# Patient Record
Sex: Female | Born: 1960 | Race: White | Hispanic: No | Marital: Married | Smoking: Never smoker
Health system: Southern US, Community
[De-identification: ages and names within clinical notes are randomized; demographics above are authoritative.]

## PROBLEM LIST (undated history)

## (undated) DIAGNOSIS — E039 Hypothyroidism, unspecified: Secondary | ICD-10-CM

## (undated) DIAGNOSIS — N189 Chronic kidney disease, unspecified: Secondary | ICD-10-CM

## (undated) DIAGNOSIS — T8859XA Other complications of anesthesia, initial encounter: Secondary | ICD-10-CM

## (undated) DIAGNOSIS — G709 Myoneural disorder, unspecified: Secondary | ICD-10-CM

## (undated) DIAGNOSIS — I999 Unspecified disorder of circulatory system: Secondary | ICD-10-CM

## (undated) DIAGNOSIS — L900 Lichen sclerosus et atrophicus: Secondary | ICD-10-CM

## (undated) DIAGNOSIS — I10 Essential (primary) hypertension: Secondary | ICD-10-CM

## (undated) DIAGNOSIS — G4700 Insomnia, unspecified: Secondary | ICD-10-CM

## (undated) DIAGNOSIS — M1990 Unspecified osteoarthritis, unspecified site: Secondary | ICD-10-CM

## (undated) DIAGNOSIS — T4145XA Adverse effect of unspecified anesthetic, initial encounter: Secondary | ICD-10-CM

## (undated) HISTORY — PX: KIDNEY STONE SURGERY: SHX686

## (undated) HISTORY — PX: ABDOMINAL HYSTERECTOMY: SHX81

---

## 2006-01-18 HISTORY — PX: KNEE ARTHROSCOPY: SUR90

## 2009-01-18 HISTORY — PX: OTHER SURGICAL HISTORY: SHX169

## 2011-02-23 ENCOUNTER — Other Ambulatory Visit: Payer: Self-pay | Admitting: Neurosurgery

## 2011-02-23 DIAGNOSIS — M545 Low back pain: Secondary | ICD-10-CM

## 2011-03-01 ENCOUNTER — Ambulatory Visit
Admission: RE | Admit: 2011-03-01 | Discharge: 2011-03-01 | Disposition: A | Discharge status: Home / Self Care | Payer: BC Managed Care – PPO | Source: Ambulatory Visit | Attending: Neurosurgery | Admitting: Neurosurgery

## 2011-03-01 DIAGNOSIS — M545 Low back pain: Secondary | ICD-10-CM

## 2011-03-24 ENCOUNTER — Other Ambulatory Visit: Payer: Self-pay | Admitting: Neurosurgery

## 2011-03-31 ENCOUNTER — Encounter (HOSPITAL_COMMUNITY): Payer: Self-pay | Admitting: Pharmacy Technician

## 2011-04-01 ENCOUNTER — Other Ambulatory Visit (HOSPITAL_COMMUNITY): Payer: Self-pay | Admitting: *Deleted

## 2011-04-01 NOTE — Pre-Procedure Instructions (Addendum)
20 Beverly Harrison  04/01/2011   Your procedure is scheduled on:  Friday, March 22nd.  Report to [facility] at 5:30AM.   Call this number if you have problems the morning of surgery: [phone]   Remember:   Do not eat food:After Midnight.  May have clear liquids: up to 4 Hours before arrival.  Clear liquids include soda, tea, black coffee, apple or grape juice, broth.  Take these medicines the morning of surgery with A SIP OF WATER: Atenolol,Synthyroid, Xanax.          Discontinue Aspirin, Coumadin, Plavix, Effient and Herbal Medications.   Do not wear jewelry, make-up or nail polish.  Do not wear lotions, powders, or perfumes. You may wear deodorant.  Do not shave 48 hours prior to surgery.  Do not bring valuables to the hospital.  Contacts, dentures or bridgework may not be worn into surgery.  Leave suitcase in the car. After surgery it may be brought to your room.  For patients admitted to the hospital, checkout time is 11:00 AM the day of discharge.   Patients discharged the day of surgery will not be allowed to drive home.  Name and phone number of your driver: --  Special Instructions: CHG Shower Use Special Wash: 1/2 bottle night before surgery and 1/2 bottle morning of surgery.   Please read over the following fact sheets that you were given: Pain Booklet, Coughing and Deep Breathing, MRSA Information and Surgical Site Infection Prevention

## 2011-04-02 ENCOUNTER — Encounter (HOSPITAL_COMMUNITY)
Admission: RE | Admit: 2011-04-02 | Discharge: 2011-04-02 | Disposition: A | Discharge status: Home / Self Care | Payer: BC Managed Care – PPO | Source: Ambulatory Visit | Attending: Anesthesiology | Admitting: Anesthesiology

## 2011-04-02 ENCOUNTER — Encounter (HOSPITAL_COMMUNITY)
Admission: RE | Admit: 2011-04-02 | Discharge: 2011-04-02 | Disposition: A | Discharge status: Home / Self Care | Payer: BC Managed Care – PPO | Source: Ambulatory Visit | Attending: Neurosurgery | Admitting: Neurosurgery

## 2011-04-02 ENCOUNTER — Encounter (HOSPITAL_COMMUNITY): Payer: Self-pay

## 2011-04-02 ENCOUNTER — Other Ambulatory Visit: Payer: Self-pay

## 2011-04-02 HISTORY — DX: Essential (primary) hypertension: I10

## 2011-04-02 HISTORY — DX: Unspecified disorder of circulatory system: I99.9

## 2011-04-02 HISTORY — DX: Hypothyroidism, unspecified: E03.9

## 2011-04-02 HISTORY — DX: Adverse effect of unspecified anesthetic, initial encounter: T41.45XA

## 2011-04-02 HISTORY — DX: Myoneural disorder, unspecified: G70.9

## 2011-04-02 HISTORY — DX: Lichen sclerosus et atrophicus: L90.0

## 2011-04-02 HISTORY — DX: Insomnia, unspecified: G47.00

## 2011-04-02 HISTORY — DX: Other complications of anesthesia, initial encounter: T88.59XA

## 2011-04-02 HISTORY — DX: Chronic kidney disease, unspecified: N18.9

## 2011-04-02 HISTORY — DX: Unspecified osteoarthritis, unspecified site: M19.90

## 2011-04-02 LAB — SURGICAL PCR SCREEN: MRSA, PCR: NEGATIVE

## 2011-04-02 NOTE — Progress Notes (Signed)
Mrs Siple states that she had labs drawn at medical DRs office this week.  I faxed a request for labs and will do any necessary labs on the day of surgery.

## 2011-04-02 NOTE — Progress Notes (Signed)
I spoke with patient and instructed her to stop taking NSAIDS.  Pt said, "then I won't have any pain meds."  I instructed pt to call MD for a new pain medication.

## 2011-04-05 NOTE — Progress Notes (Signed)
Message left with Dr. Thomes Lolling office [phone]) to fax copy of labwork done last week to [phone].

## 2011-04-08 MED ORDER — CEFAZOLIN SODIUM-DEXTROSE 2-3 GM-% IV SOLR
2.0000 g | INTRAVENOUS | Status: AC
  Administered 2011-04-09: 2 g via INTRAVENOUS
  Filled 2011-04-08: qty 50
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
## 2011-04-09 ENCOUNTER — Ambulatory Visit (HOSPITAL_COMMUNITY)
Admission: RE | Admit: 2011-04-09 | Discharge: 2011-04-10 | Disposition: A | Discharge status: Home / Self Care | Payer: BC Managed Care – PPO | Source: Ambulatory Visit | Attending: Neurosurgery | Admitting: Neurosurgery

## 2011-04-09 ENCOUNTER — Encounter (HOSPITAL_COMMUNITY)
Admission: RE | Disposition: A | Discharge status: Home / Self Care | Payer: Self-pay | Source: Ambulatory Visit | Attending: Neurosurgery

## 2011-04-09 ENCOUNTER — Encounter (HOSPITAL_COMMUNITY): Payer: Self-pay | Admitting: Surgery

## 2011-04-09 ENCOUNTER — Ambulatory Visit (HOSPITAL_COMMUNITY): Payer: BC Managed Care – PPO | Admitting: Anesthesiology

## 2011-04-09 ENCOUNTER — Encounter (HOSPITAL_COMMUNITY): Payer: Self-pay | Admitting: Anesthesiology

## 2011-04-09 ENCOUNTER — Ambulatory Visit (HOSPITAL_COMMUNITY): Payer: BC Managed Care – PPO

## 2011-04-09 DIAGNOSIS — E119 Type 2 diabetes mellitus without complications: Secondary | ICD-10-CM | POA: Insufficient documentation

## 2011-04-09 DIAGNOSIS — Z01810 Encounter for preprocedural cardiovascular examination: Secondary | ICD-10-CM | POA: Insufficient documentation

## 2011-04-09 DIAGNOSIS — I10 Essential (primary) hypertension: Secondary | ICD-10-CM | POA: Insufficient documentation

## 2011-04-09 DIAGNOSIS — M5020 Other cervical disc displacement, unspecified cervical region: Secondary | ICD-10-CM | POA: Insufficient documentation

## 2011-04-09 DIAGNOSIS — Z01812 Encounter for preprocedural laboratory examination: Secondary | ICD-10-CM | POA: Insufficient documentation

## 2011-04-09 DIAGNOSIS — M47812 Spondylosis without myelopathy or radiculopathy, cervical region: Secondary | ICD-10-CM | POA: Insufficient documentation

## 2011-04-09 DIAGNOSIS — Z01811 Encounter for preprocedural respiratory examination: Secondary | ICD-10-CM | POA: Insufficient documentation

## 2011-04-09 DIAGNOSIS — M5030 Other cervical disc degeneration, unspecified cervical region: Secondary | ICD-10-CM | POA: Insufficient documentation

## 2011-04-09 DIAGNOSIS — Z01818 Encounter for other preprocedural examination: Secondary | ICD-10-CM | POA: Insufficient documentation

## 2011-04-09 HISTORY — PX: ANTERIOR CERVICAL DECOMP/DISCECTOMY FUSION: SHX1161

## 2011-04-09 LAB — GLUCOSE, CAPILLARY: Glucose-Capillary: 132 mg/dL — ABNORMAL HIGH (ref 70–99)

## 2011-04-09 LAB — BASIC METABOLIC PANEL
BUN: 14 mg/dL (ref 6–23)
Chloride: 105 mEq/L (ref 96–112)
Creatinine, Ser: 0.78 mg/dL (ref 0.50–1.10)
GFR calc Af Amer: >90 mL/min (ref >90)

## 2011-04-09 LAB — CBC
Platelets: 220 10*3/uL (ref 150–400)
RBC: 4.48 MIL/uL (ref 3.87–5.11)
RDW: 13.9 % (ref 11.5–15.5)
WBC: 5.7 10*3/uL (ref 4.0–10.5)

## 2011-04-09 SURGERY — ANTERIOR CERVICAL DECOMPRESSION/DISCECTOMY FUSION 2 LEVELS
Anesthesia: General

## 2011-04-09 MED ORDER — DIAZEPAM 5 MG PO TABS
5.0000 mg | ORAL_TABLET | Freq: Four times a day (QID) | ORAL | Status: DC | PRN
  Administered 2011-04-09 – 2011-04-10 (×2): 5 mg via ORAL
  Filled 2011-04-09 (×2): qty 1

## 2011-04-09 MED ORDER — LINAGLIPTIN 5 MG PO TABS
5.0000 mg | ORAL_TABLET | Freq: Every evening | ORAL | Status: DC
  Filled 2011-04-09 (×2): qty 1

## 2011-04-09 MED ORDER — ATENOLOL 25 MG PO TABS
25.0000 mg | ORAL_TABLET | Freq: Every day | ORAL | Status: DC
  Administered 2011-04-09: 25 mg via ORAL
  Filled 2011-04-09 (×2): qty 1

## 2011-04-09 MED ORDER — LIDOCAINE HCL (CARDIAC) 20 MG/ML IV SOLN
INTRAVENOUS | Status: DC | PRN
  Administered 2011-04-09: 80 mg via INTRAVENOUS

## 2011-04-09 MED ORDER — SODIUM CHLORIDE 0.9 % IJ SOLN: 3.0000 mL | INTRAMUSCULAR | Status: DC | PRN

## 2011-04-09 MED ORDER — FENTANYL CITRATE 0.05 MG/ML IJ SOLN
INTRAMUSCULAR | Status: DC | PRN
  Administered 2011-04-09: 100 ug via INTRAVENOUS
  Administered 2011-04-09: 50 ug via INTRAVENOUS

## 2011-04-09 MED ORDER — SODIUM CHLORIDE 0.9 % IV SOLN
INTRAVENOUS | Status: AC
  Filled 2011-04-09: qty 500

## 2011-04-09 MED ORDER — 0.9 % SODIUM CHLORIDE (POUR BTL) OPTIME
TOPICAL | Status: DC | PRN
  Administered 2011-04-09: 1000 mL

## 2011-04-09 MED ORDER — INSULIN ASPART 100 UNIT/ML ~~LOC~~ SOLN
0.0000 [IU] | Freq: Three times a day (TID) | SUBCUTANEOUS | Status: DC
  Administered 2011-04-09: 8 [IU] via SUBCUTANEOUS

## 2011-04-09 MED ORDER — DOCUSATE SODIUM 100 MG PO CAPS
100.0000 mg | ORAL_CAPSULE | Freq: Two times a day (BID) | ORAL | Status: DC
  Administered 2011-04-09 – 2011-04-10 (×2): 100 mg via ORAL
  Filled 2011-04-09 (×3): qty 1

## 2011-04-09 MED ORDER — HYDROMORPHONE HCL PF 1 MG/ML IJ SOLN
0.2500 mg | INTRAMUSCULAR | Status: DC | PRN
  Administered 2011-04-09 (×4): 0.5 mg via INTRAVENOUS

## 2011-04-09 MED ORDER — LACTATED RINGERS IV SOLN
INTRAVENOUS | Status: DC | PRN
  Administered 2011-04-09 (×2): via INTRAVENOUS

## 2011-04-09 MED ORDER — GLYCOPYRROLATE 0.2 MG/ML IJ SOLN
INTRAMUSCULAR | Status: DC | PRN
  Administered 2011-04-09: 0.4 mg via INTRAVENOUS

## 2011-04-09 MED ORDER — HEMOSTATIC AGENTS (NO CHARGE) OPTIME
TOPICAL | Status: DC | PRN
  Administered 2011-04-09: 1 via TOPICAL

## 2011-04-09 MED ORDER — ATENOLOL 50 MG PO TABS
50.0000 mg | ORAL_TABLET | Freq: Every day | ORAL | Status: DC
  Administered 2011-04-10: 50 mg via ORAL
  Filled 2011-04-09: qty 1

## 2011-04-09 MED ORDER — MORPHINE SULFATE 4 MG/ML IJ SOLN
1.0000 mg | INTRAMUSCULAR | Status: DC | PRN
  Administered 2011-04-09 (×3): 4 mg via INTRAVENOUS
  Filled 2011-04-09 (×2): qty 1

## 2011-04-09 MED ORDER — B COMPLEX-C PO TABS
1.0000 | ORAL_TABLET | Freq: Every day | ORAL | Status: DC
  Administered 2011-04-10: 1 via ORAL
  Filled 2011-04-09 (×2): qty 1

## 2011-04-09 MED ORDER — ACETAMINOPHEN 325 MG PO TABS: 650.0000 mg | ORAL_TABLET | ORAL | Status: DC | PRN

## 2011-04-09 MED ORDER — MENTHOL 3 MG MT LOZG
1.0000 | LOZENGE | OROMUCOSAL | Status: DC | PRN
  Filled 2011-04-09: qty 9

## 2011-04-09 MED ORDER — THROMBIN 5000 UNITS EX KIT
PACK | CUTANEOUS | Status: DC | PRN
  Administered 2011-04-09 (×2): 5000 [IU] via TOPICAL

## 2011-04-09 MED ORDER — GLYBURIDE 1.25 MG PO TABS
1.2500 mg | ORAL_TABLET | Freq: Every evening | ORAL | Status: DC
  Filled 2011-04-09 (×2): qty 1

## 2011-04-09 MED ORDER — OXYCODONE-ACETAMINOPHEN 5-325 MG PO TABS
1.0000 | ORAL_TABLET | ORAL | Status: DC | PRN
  Administered 2011-04-09 – 2011-04-10 (×3): 2 via ORAL
  Filled 2011-04-09 (×3): qty 2

## 2011-04-09 MED ORDER — CEFAZOLIN SODIUM 1-5 GM-% IV SOLN
1.0000 g | Freq: Three times a day (TID) | INTRAVENOUS | Status: AC
  Administered 2011-04-09 – 2011-04-10 (×2): 1 g via INTRAVENOUS
  Filled 2011-04-09 (×2): qty 50

## 2011-04-09 MED ORDER — MIDAZOLAM HCL 5 MG/5ML IJ SOLN
INTRAMUSCULAR | Status: DC | PRN
  Administered 2011-04-09 (×2): 1 mg via INTRAVENOUS

## 2011-04-09 MED ORDER — HYDROMORPHONE HCL PF 1 MG/ML IJ SOLN
INTRAMUSCULAR | Status: AC
  Filled 2011-04-09: qty 1

## 2011-04-09 MED ORDER — OMEGA-3-ACID ETHYL ESTERS 1 G PO CAPS
1.0000 g | ORAL_CAPSULE | Freq: Every day | ORAL | Status: DC
  Administered 2011-04-10: 1 g via ORAL
  Filled 2011-04-09 (×2): qty 1

## 2011-04-09 MED ORDER — ATENOLOL 25 MG PO TABS: 25.0000 mg | ORAL_TABLET | Freq: Every day | ORAL | Status: DC

## 2011-04-09 MED ORDER — DEXTROSE 5 % IV SOLN
INTRAVENOUS | Status: DC | PRN
  Administered 2011-04-09: 08:00:00 via INTRAVENOUS

## 2011-04-09 MED ORDER — PROPOFOL 10 MG/ML IV EMUL
INTRAVENOUS | Status: DC | PRN
  Administered 2011-04-09: 150 mg via INTRAVENOUS

## 2011-04-09 MED ORDER — BACITRACIN 50000 UNITS IM SOLR
INTRAMUSCULAR | Status: AC
  Filled 2011-04-09: qty 1

## 2011-04-09 MED ORDER — ONDANSETRON HCL 4 MG/2ML IJ SOLN
4.0000 mg | INTRAMUSCULAR | Status: DC | PRN
  Administered 2011-04-09: 4 mg via INTRAVENOUS
  Filled 2011-04-09: qty 2

## 2011-04-09 MED ORDER — LACTATED RINGERS IV SOLN: INTRAVENOUS | Status: DC

## 2011-04-09 MED ORDER — ZOLPIDEM TARTRATE 5 MG PO TABS: 10.0000 mg | ORAL_TABLET | Freq: Every evening | ORAL | Status: DC | PRN

## 2011-04-09 MED ORDER — KCL IN DEXTROSE-NACL 20-5-0.45 MEQ/L-%-% IV SOLN
INTRAVENOUS | Status: DC
  Administered 2011-04-09: 16:00:00 via INTRAVENOUS
  Filled 2011-04-09 (×3): qty 1000

## 2011-04-09 MED ORDER — PANTOPRAZOLE SODIUM 40 MG IV SOLR
40.0000 mg | Freq: Every day | INTRAVENOUS | Status: DC
  Filled 2011-04-09: qty 40

## 2011-04-09 MED ORDER — DEXAMETHASONE SODIUM PHOSPHATE 4 MG/ML IJ SOLN
INTRAMUSCULAR | Status: DC | PRN
  Administered 2011-04-09: 4 mg via INTRAVENOUS

## 2011-04-09 MED ORDER — HYDROCHLOROTHIAZIDE 12.5 MG PO CAPS
12.5000 mg | ORAL_CAPSULE | Freq: Every day | ORAL | Status: DC
  Administered 2011-04-10: 12.5 mg via ORAL
  Filled 2011-04-09: qty 1

## 2011-04-09 MED ORDER — SODIUM CHLORIDE 0.9 % IR SOLN
Status: DC | PRN
  Administered 2011-04-09: 07:00:00

## 2011-04-09 MED ORDER — HYDROMORPHONE HCL 2 MG PO TABS
2.0000 mg | ORAL_TABLET | ORAL | Status: DC | PRN
  Administered 2011-04-09: 2 mg via ORAL
  Filled 2011-04-09 (×2): qty 1

## 2011-04-09 MED ORDER — ZOLPIDEM TARTRATE 5 MG PO TABS: 5.0000 mg | ORAL_TABLET | Freq: Every evening | ORAL | Status: DC | PRN

## 2011-04-09 MED ORDER — ACETAMINOPHEN 650 MG RE SUPP: 650.0000 mg | RECTAL | Status: DC | PRN

## 2011-04-09 MED ORDER — LEVOTHYROXINE SODIUM 125 MCG PO TABS
125.0000 ug | ORAL_TABLET | Freq: Every day | ORAL | Status: DC
  Administered 2011-04-10: 125 ug via ORAL
  Filled 2011-04-09: qty 1

## 2011-04-09 MED ORDER — SIMVASTATIN 5 MG PO TABS
5.0000 mg | ORAL_TABLET | Freq: Every day | ORAL | Status: DC
  Filled 2011-04-09 (×2): qty 1

## 2011-04-09 MED ORDER — ALPRAZOLAM 0.5 MG PO TABS: 0.5000 mg | ORAL_TABLET | Freq: Three times a day (TID) | ORAL | Status: DC | PRN

## 2011-04-09 MED ORDER — ADULT MULTIVITAMIN W/MINERALS CH
1.0000 | ORAL_TABLET | Freq: Every day | ORAL | Status: DC
  Administered 2011-04-10: 1 via ORAL
  Filled 2011-04-09 (×2): qty 1

## 2011-04-09 MED ORDER — LOSARTAN POTASSIUM-HCTZ 100-12.5 MG PO TABS: 1.0000 | ORAL_TABLET | Freq: Every evening | ORAL | Status: DC

## 2011-04-09 MED ORDER — KCL IN DEXTROSE-NACL 20-5-0.45 MEQ/L-%-% IV SOLN
INTRAVENOUS | Status: AC
  Filled 2011-04-09: qty 1000

## 2011-04-09 MED ORDER — ONDANSETRON HCL 4 MG/2ML IJ SOLN
INTRAMUSCULAR | Status: DC | PRN
  Administered 2011-04-09: 4 mg via INTRAVENOUS

## 2011-04-09 MED ORDER — METFORMIN HCL ER 500 MG PO TB24
500.0000 mg | ORAL_TABLET | Freq: Two times a day (BID) | ORAL | Status: DC
  Administered 2011-04-10: 500 mg via ORAL
  Filled 2011-04-09 (×4): qty 1

## 2011-04-09 MED ORDER — NEOSTIGMINE METHYLSULFATE 1 MG/ML IJ SOLN
INTRAMUSCULAR | Status: DC | PRN
  Administered 2011-04-09: 3 mg via INTRAVENOUS

## 2011-04-09 MED ORDER — BUPIVACAINE HCL (PF) 0.5 % IJ SOLN
INTRAMUSCULAR | Status: DC | PRN
  Administered 2011-04-09: 5 mL

## 2011-04-09 MED ORDER — LIDOCAINE-EPINEPHRINE 1 %-1:100000 IJ SOLN
INTRAMUSCULAR | Status: DC | PRN
  Administered 2011-04-09: 5 mL

## 2011-04-09 MED ORDER — PHENOL 1.4 % MT LIQD
1.0000 | OROMUCOSAL | Status: DC | PRN
  Administered 2011-04-09: 1 via OROMUCOSAL
  Filled 2011-04-09: qty 177

## 2011-04-09 MED ORDER — SODIUM CHLORIDE 0.9 % IV SOLN: 250.0000 mL | INTRAVENOUS | Status: DC

## 2011-04-09 MED ORDER — PANTOPRAZOLE SODIUM 40 MG PO TBEC
40.0000 mg | DELAYED_RELEASE_TABLET | Freq: Every day | ORAL | Status: DC
  Filled 2011-04-09 (×2): qty 1

## 2011-04-09 MED ORDER — MORPHINE SULFATE 2 MG/ML IJ SOLN: 0.0500 mg/kg | INTRAMUSCULAR | Status: DC | PRN

## 2011-04-09 MED ORDER — ROCURONIUM BROMIDE 100 MG/10ML IV SOLN
INTRAVENOUS | Status: DC | PRN
  Administered 2011-04-09: 10 mg via INTRAVENOUS
  Administered 2011-04-09: 50 mg via INTRAVENOUS

## 2011-04-09 MED ORDER — LOSARTAN POTASSIUM 50 MG PO TABS
100.0000 mg | ORAL_TABLET | Freq: Every day | ORAL | Status: DC
  Administered 2011-04-10: 100 mg via ORAL
  Filled 2011-04-09: qty 2

## 2011-04-09 MED ORDER — SODIUM CHLORIDE 0.9 % IJ SOLN: 3.0000 mL | Freq: Two times a day (BID) | INTRAMUSCULAR | Status: DC

## 2011-04-09 MED ORDER — VITAMIN B COMPLEX PO TABS: 1.0000 | ORAL_TABLET | Freq: Every day | ORAL | Status: DC

## 2011-04-09 SURGICAL SUPPLY — 67 items
BAG DECANTER FOR FLEXI CONT (MISCELLANEOUS) ×2 IMPLANT
BANDAGE GAUZE ELAST BULKY 4 IN (GAUZE/BANDAGES/DRESSINGS) ×4 IMPLANT
BENZOIN TINCTURE PRP APPL 2/3 (GAUZE/BANDAGES/DRESSINGS) ×2 IMPLANT
BIT DRILL NEURO 2X3.1 SFT TUCH (MISCELLANEOUS) ×1 IMPLANT
BLADE ULTRA TIP 2M (BLADE) IMPLANT
BLOCK PROFUSE SZ 6 (Bone Implant) ×2 IMPLANT
BUR BARREL STRAIGHT FLUTE 4.0 (BURR) ×2 IMPLANT
CAGE CERVICAL 8MM (Cage) ×4 IMPLANT
CANISTER SUCTION 2500CC (MISCELLANEOUS) ×2 IMPLANT
CLOTH BEACON ORANGE TIMEOUT ST (SAFETY) ×2 IMPLANT
CONT SPEC 4OZ CLIKSEAL STRL BL (MISCELLANEOUS) ×2 IMPLANT
COVER MAYO STAND STRL (DRAPES) ×2 IMPLANT
DERMABOND ADVANCED (GAUZE/BANDAGES/DRESSINGS) ×1
DERMABOND ADVANCED .7 DNX12 (GAUZE/BANDAGES/DRESSINGS) ×1 IMPLANT
DRAPE LAPAROTOMY 100X72 PEDS (DRAPES) ×2 IMPLANT
DRAPE MICROSCOPE LEICA (MISCELLANEOUS) ×2 IMPLANT
DRAPE POUCH INSTRU U-SHP 10X18 (DRAPES) ×2 IMPLANT
DRAPE PROXIMA HALF (DRAPES) IMPLANT
DRESSING TELFA 8X3 (GAUZE/BANDAGES/DRESSINGS) ×2 IMPLANT
DRILL NEURO 2X3.1 SOFT TOUCH (MISCELLANEOUS) ×2
DURAPREP 6ML APPLICATOR 50/CS (WOUND CARE) ×2 IMPLANT
ELECT COATED BLADE 2.86 ST (ELECTRODE) ×2 IMPLANT
ELECT REM PT RETURN 9FT ADLT (ELECTROSURGICAL) ×2
ELECTRODE REM PT RTRN 9FT ADLT (ELECTROSURGICAL) ×1 IMPLANT
GAUZE SPONGE 4X4 16PLY XRAY LF (GAUZE/BANDAGES/DRESSINGS) IMPLANT
GLOVE BIO SURGEON STRL SZ8 (GLOVE) ×2 IMPLANT
GLOVE BIOGEL M 8.0 STRL (GLOVE) ×2 IMPLANT
GLOVE BIOGEL PI IND STRL 8 (GLOVE) ×1 IMPLANT
GLOVE BIOGEL PI IND STRL 8.5 (GLOVE) ×1 IMPLANT
GLOVE BIOGEL PI INDICATOR 8 (GLOVE) ×1
GLOVE BIOGEL PI INDICATOR 8.5 (GLOVE) ×1
GLOVE ECLIPSE 8.0 STRL XLNG CF (GLOVE) ×2 IMPLANT
GLOVE EXAM NITRILE LRG STRL (GLOVE) IMPLANT
GLOVE EXAM NITRILE MD LF STRL (GLOVE) IMPLANT
GLOVE EXAM NITRILE XL STR (GLOVE) IMPLANT
GLOVE EXAM NITRILE XS STR PU (GLOVE) IMPLANT
GLOVE INDICATOR 7.5 STRL GRN (GLOVE) ×2 IMPLANT
GLOVE SS BIOGEL STRL SZ 7 (GLOVE) ×1 IMPLANT
GLOVE SUPERSENSE BIOGEL SZ 7 (GLOVE) ×1
GOWN BRE IMP SLV AUR LG STRL (GOWN DISPOSABLE) ×2 IMPLANT
GOWN BRE IMP SLV AUR XL STRL (GOWN DISPOSABLE) ×4 IMPLANT
GOWN STRL REIN 2XL LVL4 (GOWN DISPOSABLE) ×2 IMPLANT
HEAD HALTER (SOFTGOODS) ×2 IMPLANT
KIT BASIN OR (CUSTOM PROCEDURE TRAY) ×2 IMPLANT
KIT ROOM TURNOVER OR (KITS) ×2 IMPLANT
NEEDLE HYPO 18GX1.5 BLUNT FILL (NEEDLE) ×2 IMPLANT
NEEDLE HYPO 25X1 1.5 SAFETY (NEEDLE) ×2 IMPLANT
NEEDLE SPNL 22GX3.5 QUINCKE BK (NEEDLE) ×2 IMPLANT
NS IRRIG 1000ML POUR BTL (IV SOLUTION) ×2 IMPLANT
PACK LAMINECTOMY NEURO (CUSTOM PROCEDURE TRAY) ×2 IMPLANT
PAD ARMBOARD 7.5X6 YLW CONV (MISCELLANEOUS) ×2 IMPLANT
PIN DISTRACTION 14MM (PIN) ×4 IMPLANT
PLATE 32MM (Plate) ×2 IMPLANT
RUBBERBAND STERILE (MISCELLANEOUS) ×4 IMPLANT
SCREW 12MM (Screw) ×12 IMPLANT
SPONGE GAUZE 4X4 12PLY (GAUZE/BANDAGES/DRESSINGS) IMPLANT
SPONGE INTESTINAL PEANUT (DISPOSABLE) ×2 IMPLANT
SPONGE SURGIFOAM ABS GEL SZ50 (HEMOSTASIS) ×6 IMPLANT
STAPLER SKIN PROX WIDE 3.9 (STAPLE) ×2 IMPLANT
STRIP CLOSURE SKIN 1/2X4 (GAUZE/BANDAGES/DRESSINGS) ×2 IMPLANT
SUT VIC AB 3-0 SH 8-18 (SUTURE) ×2 IMPLANT
SYR 20ML ECCENTRIC (SYRINGE) ×2 IMPLANT
SYR 3ML LL SCALE MARK (SYRINGE) ×2 IMPLANT
TOWEL OR 17X24 6PK STRL BLUE (TOWEL DISPOSABLE) ×2 IMPLANT
TOWEL OR 17X26 10 PK STRL BLUE (TOWEL DISPOSABLE) ×2 IMPLANT
TRAP SPECIMEN MUCOUS 40CC (MISCELLANEOUS) ×2 IMPLANT
WATER STERILE IRR 1000ML POUR (IV SOLUTION) ×2 IMPLANT

## 2011-04-09 NOTE — Anesthesia Postprocedure Evaluation (Signed)
  Anesthesia Post-op Note  Patient: Beverly Harrison  Procedure(s) Performed: Procedure(s) (LRB): ANTERIOR CERVICAL DECOMPRESSION/DISCECTOMY FUSION 2 LEVELS (N/A)  Patient Location: PACU  Anesthesia Type: General  Level of Consciousness: awake  Airway and Oxygen Therapy: Patient Spontanous Breathing  Post-op Pain: mild  Post-op Assessment: Post-op Vital signs reviewed  Post-op Vital Signs: Reviewed  Complications: No apparent anesthesia complications

## 2011-04-09 NOTE — Progress Notes (Signed)
Patient has had some nausea and vomiting with elevated blood sugars.  Strength full bilateral deltoids, biceps, triceps, hand intrinsics.  Dressing CDI. Overall doing well, but will observe tonight.

## 2011-04-09 NOTE — Progress Notes (Signed)
Medicated with po dilaudid, pt then to vomit with mod amt emesis noted, medicated with IV morphine at this time

## 2011-04-09 NOTE — Preoperative (Signed)
Beta Blockers   Reason not to administer Beta Blockers:Not Applicable 

## 2011-04-09 NOTE — Interval H&P Note (Signed)
History and Physical Interval Note:  04/09/2011 7:22 AM  Beverly Harrison  has presented today for surgery, with the diagnosis of cervical herniated disc cervical spondylosis cervical radiculopathy  The various methods of treatment have been discussed with the patient and family. After consideration of risks, benefits and other options for treatment, the patient has consented to  Procedure(s) (LRB): ANTERIOR CERVICAL DECOMPRESSION/DISCECTOMY FUSION 2 LEVELS (N/A) as a surgical intervention .  The patients' history has been reviewed, patient examined, no change in status, stable for surgery.  I have reviewed the patients' chart and labs.  Questions were answered to the patient's satisfaction.     [name] D  Date of Initial H&P: 03/24/2011  History reviewed, patient examined, no change in status, stable for surgery.

## 2011-04-09 NOTE — Plan of Care (Signed)
Problem: Consults Goal: Spinal Surgery Patient Education See Patient Education Module for education specifics. Outcome: Completed/Met Date Met:  04/09/11 Turn cough deep  Breath, log roll,

## 2011-04-09 NOTE — Progress Notes (Signed)
Orthopedic Tech Progress Note Patient Details:  Beverly Harrison [birth year] [id number]  Other Ortho Devices Type of Ortho Device: [place] cervical collar (soft cervical collar) Ortho Device Location: neck Ortho Device Interventions: Application   Donn Wilmot 04/09/2011, 4:40 PM

## 2011-04-09 NOTE — Transfer of Care (Signed)
Immediate Anesthesia Transfer of Care Note  Patient: Beverly Harrison  Procedure(s) Performed: Procedure(s) (LRB): ANTERIOR CERVICAL DECOMPRESSION/DISCECTOMY FUSION 2 LEVELS (N/A)  Patient Location: PACU  Anesthesia Type: General  Level of Consciousness: oriented, sedated and patient cooperative  Airway & Oxygen Therapy: Patient Spontanous Breathing and Patient connected to nasal cannula oxygen  Post-op Assessment: Report given to PACU RN, Post -op Vital signs reviewed and stable and Patient moving all extremities  Post vital signs: Reviewed and stable  Complications: No apparent anesthesia complications

## 2011-04-09 NOTE — Op Note (Signed)
04/09/2011  9:39 AM  PATIENT:  Beverly Harrison  51 y.o. female  PRE-OPERATIVE DIAGNOSIS:  cervical herniated disc, cervical spondylosis, cervical disc degeneration, cervical radiculopathy C 4/5, C 5/6  POST-OPERATIVE DIAGNOSIS:  cervical herniated disc cervical spondylosis cervical radiculopathy C 4/5, C 5/6  PROCEDURE:  Procedure(s) (LRB): ANTERIOR CERVICAL DECOMPRESSION/DISCECTOMY FUSION 2 LEVELS with PEEK cages, autograft, allograft, plate (N/A)  SURGEON:  Surgeon(s) and Role:    * Maeola Harman, MD - Primary  PHYSICIAN ASSISTANT: Jeral Fruit, MD ASSISTANTS: Poteat, RN   ANESTHESIA:   general  EBL:  Total I/O In: 1050 [I.V.:1050] Out: -   BLOOD ADMINISTERED:none  DRAINS: none   LOCAL MEDICATIONS USED:  LIDOCAINE   SPECIMEN:  No Specimen  DISPOSITION OF SPECIMEN:  N/A  COUNTS:  YES  TOURNIQUET:  * No tourniquets in log *  DICTATION: DICTATION: Patient is 51 year old female with right arm pain and weakness with HNP, spondylosis, radiculopathy C 4/5, C 5/6  PROCEDURE: Patient was brought to operating room and following the smooth and uncomplicated induction of general endotracheal anesthesia her head was placed on a horseshoe head holder she was placed in 5 pounds of Holter traction and her anterior neck was prepped and draped in usual sterile fashion. An incision was made on the left side of midline after infiltrating the skin and subcutaneous tissues with local lidocaine. The platysmal layer was incised and subplatysmal dissection was performed exposing the anterior border sternocleidomastoid muscle. Using blunt dissection the carotid sheath was kept lateral and trachea and esophagus kept medial exposing the anterior cervical spine. A bent spinal needle was placed it was felt to be the C 4/5, C 3/4 level and this was confirmed on intraoperative x-ray. Longus coli muscles were taken down from the anterior cervical spine using electrocautery and key elevator and self-retaining  retractor was placed exposing the C 4/5, C 5/6 levels. The interspaces were incised and a thorough discectomy was performed. Distraction pins were placed. Initially the C 4/5 level was operated. Uncinate spurs and central spondylitic ridges were drilled down with a high-speed drill. The spinal cord dura and both C5 nerve roots were widely decompressed with particular attention paid to the right C 5 nerve root. Hemostasis was assured. After trial sizing an 8 mm peek interbody cage was selected and packed with profuse block and autograft. This was tamped into position and countersunk appropriately. Attention was the paid to the C5/6 level, where similar decompression was performed.  Uncinate spurs and central spondylitic ridges were drilled down with a high-speed drill. The spinal cord dura and both C6 nerve roots were widely decompressed. Hemostasis was assured. After trial sizing a 8 mm peek interbody cage was selected and packed with profuse block and autograft. This was tamped into position and countersunk appropriately.Distraction weight was removed. A 32 mm trestle luxe anterior cervical plate was affixed to the cervical spine with 12 mm variable-angle screws 2 at C4, 2 at C5 and 2 at C6. All screws were well-positioned and locking mechanisms were engaged. A final X ray was obtained which showed well positioned grafts and anterior plate without complicating features. Soft tissues were inspected and found to be in good repair. The wound was irrigated. The platysma layer was closed with 3-0 Vicryl stitches and the skin was reapproximated with 3-0 Vicryl subcuticular stitches. The wound was dressed with Dermabond. Counts were correct at the end of the case. Patient was extubated and taken to recovery in stable and satisfactory condition.    PLAN  OF CARE: Admit for overnight observation  PATIENT DISPOSITION:  PACU - hemodynamically stable.   Delay start of Pharmacological VTE agent (>24hrs) due to surgical  blood loss or risk of bleeding: yes

## 2011-04-09 NOTE — Anesthesia Procedure Notes (Signed)
Procedure Name: Intubation Date/Time: 04/09/2011 7:49 AM Performed by: Julianne Rice K Pre-anesthesia Checklist: Emergency Drugs available, Patient identified, Timeout performed, Patient being monitored and Suction available Patient Re-evaluated:Patient Re-evaluated prior to inductionOxygen Delivery Method: Circle system utilized Preoxygenation: Pre-oxygenation with 100% oxygen Intubation Type: IV induction Ventilation: Mask ventilation without difficulty Laryngoscope Size: Miller and 2 Grade View: Grade II Tube type: Oral Tube size: 7.5 mm Number of attempts: 1 Airway Equipment and Method: Stylet Placement Confirmation: ETT inserted through vocal cords under direct vision,  breath sounds checked- equal and bilateral and positive ETCO2 Secured at: 21 cm Tube secured with: Tape Dental Injury: Teeth and Oropharynx as per pre-operative assessment

## 2011-04-09 NOTE — Anesthesia Preprocedure Evaluation (Addendum)
Anesthesia Evaluation  Patient identified by MRN, date of birth, ID band Patient awake    Reviewed: Allergy & Precautions, H&P , NPO status , Patient's Chart, lab work & pertinent test results, reviewed documented beta blocker date and time   Airway Mallampati: II      Dental   Pulmonary  breath sounds clear to auscultation        Cardiovascular hypertension, Pt. on medications and Pt. on home beta blockers Rhythm:Regular Rate:Normal     Neuro/Psych  Neuromuscular disease negative psych ROS   GI/Hepatic negative GI ROS, Neg liver ROS,   Endo/Other  Diabetes mellitus-, Well Controlled, Type 2Hypothyroidism   Renal/GU negative Renal ROS     Musculoskeletal   Abdominal   Peds  Hematology negative hematology ROS (+)   Anesthesia Other Findings   Reproductive/Obstetrics                          Anesthesia Physical Anesthesia Plan  ASA: III  Anesthesia Plan: General   Post-op Pain Management:    Induction: Intravenous  Airway Management Planned: Oral ETT  Additional Equipment:   Intra-op Plan:   Post-operative Plan: Extubation in OR  Informed Consent: I have reviewed the patients History and Physical, chart, labs and discussed the procedure including the risks, benefits and alternatives for the proposed anesthesia with the patient or authorized representative who has indicated his/her understanding and acceptance.   Dental advisory given  Plan Discussed with: Anesthesiologist and Surgeon  Anesthesia Plan Comments:         Anesthesia Quick Evaluation

## 2011-04-10 MED ORDER — OXYCODONE-ACETAMINOPHEN 5-325 MG PO TABS: 1.0000 | ORAL_TABLET | ORAL | Status: AC | PRN

## 2011-04-10 MED ORDER — DIAZEPAM 5 MG PO TABS: 5.0000 mg | ORAL_TABLET | Freq: Four times a day (QID) | ORAL | Status: AC | PRN

## 2011-04-10 NOTE — Evaluation (Addendum)
Physical Therapy Evaluation Patient Details Name: Beverly Harrison MRN: [id number] DOB: 12-09-1960 Today's Date: 04/10/2011  Problem List: There is no problem list on file for this patient.   Past Medical History:  Past Medical History  Diagnosis Date  . Complication of anesthesia     Difficulty breathing. (2006) Hyotension (2011)  . Hypertension   . Diabetes mellitus   . Insomnia   . Hypothyroidism   . Chronic kidney disease     Stone  . Arthritis   . Neuromuscular disorder   . Constipation   . Lichen sclerosus   . Vascular abnormality     "vascular Flow"  problem. caused me to get nauseous and have a terrible headache when they were trying to fit me with cervical traction."   Past Surgical History:  Past Surgical History  Procedure Date  . Vaginal mesh, bladder sling 2011  . Kidney stone surgery     Cystoscopy  . Abdominal hysterectomy   . Knee arthroscopy 2008    Left    PT Assessment/Plan/Recommendation PT Assessment Clinical Impression Statement: Pt s/p 2 level cervical fushion. Pt mobilizing very well, will have necessary level of support at home. Pt declining HHPT. PT will sign off.  PT Recommendation/Assessment: Patent does not need any further PT services No Skilled PT: Patient will have necessary level of assist by caregiver at discharge;Patient is supervision for all activity/mobility;All education completed PT Recommendation Follow Up Recommendations: No PT follow up Equipment Recommended: None recommended by PT  PT Evaluation Precautions/Restrictions  Precautions Precautions: Other (comment) (Cervical ) Precaution Comments: Verbally reviewed cervical precautions Required Braces or Orthoses: Yes Cervical Brace: Soft collar Prior Functioning  Home Living Lives With: Spouse Type of Home: House Home Layout: One level Home Access: Stairs to enter Entrance Stairs-Rails: None Entrance Stairs-Number of Steps: 2 Bathroom Shower/Tub: Therapist, art: Handicapped height Bathroom Accessibility: Yes How Accessible: Accessible via walker;Accessible via wheelchair Home Adaptive Equipment: Built-in shower seat Prior Function Level of Independence: Independent with basic ADLs;Independent with gait Driving: Yes 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  Cognition Cognition Arousal/Alertness: Awake/alert Overall Cognitive Status: Appears within functional limits for tasks assessed Orientation Level: Oriented X4 Sensation/Coordination Sensation Light Touch: Appears Intact (bil. LEs to light touch) Coordination Gross Motor Movements are Fluid and Coordinated: Yes Fine Motor Movements are Fluid and Coordinated: Yes Extremity Assessment RLE Assessment RLE Assessment: Within Functional Limits LLE Assessment LLE Assessment: Within Functional Limits Mobility (including Balance) Bed Mobility Bed Mobility: No (seated EOB at start) Verbally reviewed log roll technique, educated on importance of utilizing technique. Transfers Transfers: Yes Sit to Stand: 5: Supervision Sit to Stand Details (indicate cue type and reason): Supervision for safety for initial standing, no physical assist needed.  Stand to Sit: 6: Modified independent (Device/Increase time) Ambulation/Gait Ambulation/Gait: Yes Ambulation/Gait Assistance: 5: Supervision Ambulation/Gait Assistance Details (indicate cue type and reason): Supervision for safety although no overt losses of balance. Pt ambulated with slightly flexed knees which pt reports is baseline from back injury. Pt with slightly widdened base of support.  Ambulation Distance (Feet): 300 Feet Assistive device: None Gait Pattern: Step-through pattern (decreased foot clearance, slightly increased base of support) Stairs: Yes Stairs Assistance: Other (comment) (min-guard assist) Stairs Assistance Details (indicate cue type and reason): Pt requesting use of rail, pt  reports at home she holds onto husband but declined to hold PT's hand. No physical assist needed.  Stair Management Technique: One rail Right Number of Stairs: 8  Height  of Stairs: 6  Wheelchair Mobility Wheelchair Mobility: No  Posture/Postural Control Posture/Postural Control: No significant limitations Balance Balance Assessed: Yes Static Standing Balance Static Standing - Balance Support: No upper extremity supported Static Standing - Level of Assistance: 5: Stand by assistance Static Standing - Comment/# of Minutes: increased sway but no loss of balance.  Rhomberg - Eyes Closed: 45  End of Session PT - End of Session Equipment Utilized During Treatment: Gait belt;Cervical collar Activity Tolerance: Patient tolerated treatment well Patient left: in bed;with call bell in reach;with family/visitor present Nurse Communication: Mobility status for transfers;Mobility status for ambulation General Behavior During Session: [facility] for tasks performed Cognition: [facility] for tasks performed  Wilhemina Bonito 04/10/2011, 9:00 AM  Sherie Don) Carleene Mains PT, DPT Acute Rehabilitation [phone]

## 2011-04-10 NOTE — Progress Notes (Signed)
Subjective: Patient reports doing well.  Objective: Vital signs in last 24 hours: Temp:  [96.8 F (36 C)-98.1 F (36.7 C)] 97.9 F (36.6 C) (03/23 0350) Pulse Rate:  [65-94] 73  (03/23 0350) Resp:  [16-35] 16  (03/23 0350) BP: (113-163)/(73-94) 117/74 mmHg (03/23 0350) SpO2:  [89 %-99 %] 99 % (03/23 0350)  Intake/Output from previous day: 03/22 0701 - 03/23 0700 In: 1750 [I.V.:1750] Out: 55 [Emesis/NG output:30; Blood:25] Intake/Output this shift:    Physical Exam: Full strength bilateral upper extremities, no numbness. Dressing CDI  Lab Results:  [facility] 04/09/11 0629  WBC 5.7  HGB 12.8  HCT 39.5  PLT 220   BMET  Basename 04/09/11 0629  NA 140  K 3.4*  CL 105  CO2 27  GLUCOSE 126*  BUN 14  CREATININE 0.78  CALCIUM 9.4    Studies/Results: Dg Cervical Spine 2-3 Views  04/09/2011  *RADIOLOGY REPORT*  Clinical Data: Cervical disc disease.  CERVICAL SPINE - 2-3 VIEW  Comparison: None.  Findings: Radiograph #1 shows localization needles at the C3-4 C4-5 levels.  Radiograph #2 demonstrates the patient has undergone anterior cervical fusion at C4-5 and C5-6.  IMPRESSION: Anterior cervical fusions performed at C4-5 and C5-6.  Original Report Authenticated By: Gwynn Burly, M.D.    Assessment/Plan: Doing well.  DC home    LOS: 1 day    Dorian Heckle, MD 04/10/2011, 7:25 AM

## 2011-04-10 NOTE — Discharge Instructions (Signed)

## 2011-04-10 NOTE — Progress Notes (Signed)
PT NOTE: Pt doing well, no acute PT needs identified. Pt declining HHPT. Will sign off, please reorder if needed.  Rafaella Kole (Beverely Pace) Carleene Mains PT, DPT Acute Rehabilitation [phone]

## 2011-04-10 NOTE — Discharge Summary (Signed)
Physician Discharge Summary  Patient ID: Beverly Harrison MRN: [id number] DOB/AGE: 07/04/1960 51 y.o.  Admit date: 04/09/2011 Discharge date: 04/10/2011  Admission Diagnoses:HNP, spondylosis, DDD, radiculopathy C4/5, C5/6  Discharge Diagnoses: HNP, spondylosis, DDD, radiculopathy C4/5, C5/6 Active Problems:  * No active hospital problems. *    Discharged Condition: good  Hospital Course: Uncomplicated ACDF C4/5, C5/6  Consults: None  Significant Diagnostic Studies: None  Treatments: surgery: Uncomplicated ACDF C4/5, C5/6  Discharge Exam: Blood pressure 104/68, pulse 65, temperature 97.9 F (36.6 C), temperature source Oral, resp. rate 16, weight 92.534 kg (204 lb), SpO2 95.00%. Neurologic: Alert and oriented X 3, normal strength and tone. Normal symmetric reflexes. Normal coordination and gait Wound:CDI  Disposition: Home   Medication List  As of 04/10/2011  9:22 AM   TAKE these medications         ALPRAZolam 0.5 MG tablet   Commonly known as: XANAX   Take 0.5 mg by mouth 3 (three) times daily as needed. For anxiety      aspirin EC 81 MG tablet   Take 81 mg by mouth daily.      atenolol 50 MG tablet   Commonly known as: TENORMIN   Take 25-50 mg by mouth daily. Take 1 tablet in the morning and 0.5 tablet in the evening.      CO Q 10 PO   Take 1 capsule by mouth daily.      diazepam 5 MG tablet   Commonly known as: VALIUM   Take 1 tablet (5 mg total) by mouth every 6 (six) hours as needed.      glyBURIDE 1.25 MG tablet   Commonly known as: DIABETA   Take 1.25 mg by mouth every evening.      levothyroxine 125 MCG tablet   Commonly known as: SYNTHROID, LEVOTHROID   Take 125 mcg by mouth daily.      losartan-hydrochlorothiazide 100-12.5 MG per tablet   Commonly known as: HYZAAR   Take 1 tablet by mouth every evening.      metFORMIN 500 MG (MOD) 24 hr tablet   Commonly known as: GLUMETZA   Take 500 mg by mouth 2 (two) times daily with a meal.     mulitivitamin with minerals Tabs   Take 1 tablet by mouth daily.      omega-3 acid ethyl esters 1 G capsule   Commonly known as: LOVAZA   Take 1 g by mouth daily.      oxyCODONE-acetaminophen 5-325 MG per tablet   Commonly known as: PERCOCET   Take 1-2 tablets by mouth every 4 (four) hours as needed.      pravastatin 10 MG tablet   Commonly known as: PRAVACHOL   Take 10 mg by mouth at bedtime.      TRADJENTA 5 MG Tabs tablet   Generic drug: linagliptin   Take 5 mg by mouth every evening.      Vitamin B Complex Tabs   Take 1 tablet by mouth daily.      zolpidem 5 MG tablet   Commonly known as: AMBIEN   Take 5 mg by mouth at bedtime as needed. For sleep             Signed: Dorian Heckle, MD 04/10/2011, 9:22 AM

## 2011-04-10 NOTE — Progress Notes (Signed)
CSW received consult for SNF. PT with no f/u recommendations. CSW signing off as no other CSW needs identified.  Dellie Burns, MSW, [place] [phone] (weekend)

## 2011-04-10 NOTE — Evaluation (Signed)
Occupational Therapy Evaluation Patient Details Name: Beverly Harrison MRN: [id number] DOB: 1960-04-30 Today's Date: 04/10/2011  Problem List: There is no problem list on file for this patient.   Past Medical History:  Past Medical History  Diagnosis Date  . Complication of anesthesia     Difficulty breathing. (2006) Hyotension (2011)  . Hypertension   . Diabetes mellitus   . Insomnia   . Hypothyroidism   . Chronic kidney disease     Stone  . Arthritis   . Neuromuscular disorder   . Constipation   . Lichen sclerosus   . Vascular abnormality     "vascular Flow"  problem. caused me to get nauseous and have a terrible headache when they were trying to fit me with cervical traction."   Past Surgical History:  Past Surgical History  Procedure Date  . Vaginal mesh, bladder sling 2011  . Kidney stone surgery     Cystoscopy  . Abdominal hysterectomy   . Knee arthroscopy 2008    Left    OT Assessment/Plan/Recommendation OT Assessment Clinical Impression Statement: Pt admitted for ACDF/Discectomy fusion of 2 levels.  Pt is at a supervision to set up level in ADL and mobility.  Instructed in cervical precautions related to ADL.  No equipment or further OT needs.  Will have 24 hour care of family upon d/c. OT Recommendation/Assessment: Patient does not need any further OT services OT Recommendation Follow Up Recommendations: No OT follow up Equipment Recommended: None recommended by OT  OT Evaluation Precautions/Restrictions  Precautions Precautions: Other (comment) (cervical) Precaution Comments: Verbally reviewed cervical precautions Required Braces or Orthoses: Yes Cervical Brace: Soft collar Prior Functioning Home Living Lives With: Spouse Type of Home: House Home Layout: One level Home Access: Stairs to enter Entrance Stairs-Rails: None Entrance Stairs-Number of Steps: 2 Bathroom Shower/Tub: Health visitor: Handicapped height Bathroom Accessibility:  Yes How Accessible: Accessible via walker;Accessible via wheelchair Home Adaptive Equipment: Built-in shower seat Prior Function Level of Independence: Independent with basic ADLs;Independent with gait Able to Take Stairs?: Yes Driving: Yes 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  ADL ADL Eating/Feeding: Simulated;Independent Where Assessed - Eating/Feeding: Edge of bed Grooming: Simulated;Set up Where Assessed - Grooming: Standing at sink Upper Body Bathing: Simulated;Set up Where Assessed - Upper Body Bathing: Sitting, bed Lower Body Bathing: Simulated;Set up Lower Body Bathing Details (indicate cue type and reason): instructed to use long handle sponge or cross foot over opposite knee Where Assessed - Lower Body Bathing: Sitting, bed Upper Body Dressing: Set up;Simulated Where Assessed - Upper Body Dressing: Sitting, bed Lower Body Dressing: Simulated;Set up Lower Body Dressing Details (indicate cue type and reason): instructed to cross foot over opposite knee Where Assessed - Lower Body Dressing: Sitting, bed;Sit to stand from bed Toilet Transfer: Simulated;Supervision/safety ADL Comments: Instructed pt in cervical precautions related to ADL. Vision/Perception  Vision - History Patient Visual Report: No change from baseline Cognition Cognition Arousal/Alertness: Awake/alert Overall Cognitive Status: Appears within functional limits for tasks assessed Orientation Level: Oriented X4 Sensation/Coordination Sensation Light Touch: Appears Intact Hot/Cold: Appears Intact Proprioception: Appears Intact Coordination Gross Motor Movements are Fluid and Coordinated: Yes Fine Motor Movements are Fluid and Coordinated: Yes Extremity Assessment RUE Assessment RUE Assessment: Exceptions to [facility] (intact to 90 degrees, strength not formally assessed) LUE Assessment LUE Assessment: Exceptions to [facility] (intact to 90 degrees, strength not formally  assessed) Mobility  Bed Mobility Bed Mobility: No (seated EOB at start) Transfers Transfers: Yes Sit to Stand: 5:  Supervision Sit to Stand Details (indicate cue type and reason): Supervision for safety for initial standing, no physical assist needed.  Stand to Sit: 6: Modified independent (Device/Increase time) End of Session OT - End of Session Activity Tolerance: Patient tolerated treatment well Patient left: in bed;with call bell in reach;with family/visitor present General Behavior During Session: [facility] for tasks performed Cognition: [facility] for tasks performed   Evern Bio 04/10/2011, 10:07 AM  [phone]

## 2011-04-12 ENCOUNTER — Encounter (HOSPITAL_COMMUNITY): Payer: Self-pay | Admitting: Neurosurgery

## 2011-04-12 LAB — GLUCOSE, CAPILLARY
Glucose-Capillary: 256 mg/dL — ABNORMAL HIGH (ref 70–99)
Glucose-Capillary: 262 mg/dL — ABNORMAL HIGH (ref 70–99)
Glucose-Capillary: 187 mg/dL — ABNORMAL HIGH (ref 70–99)

## 2012-09-08 IMAGING — CR DG CHEST 2V
2 series · 2 of 2 positions shown · non-contrast
Comparison: None.

CLINICAL DATA: Preop CHEST - 2 VIEW

[view not recorded (1 of 2)]
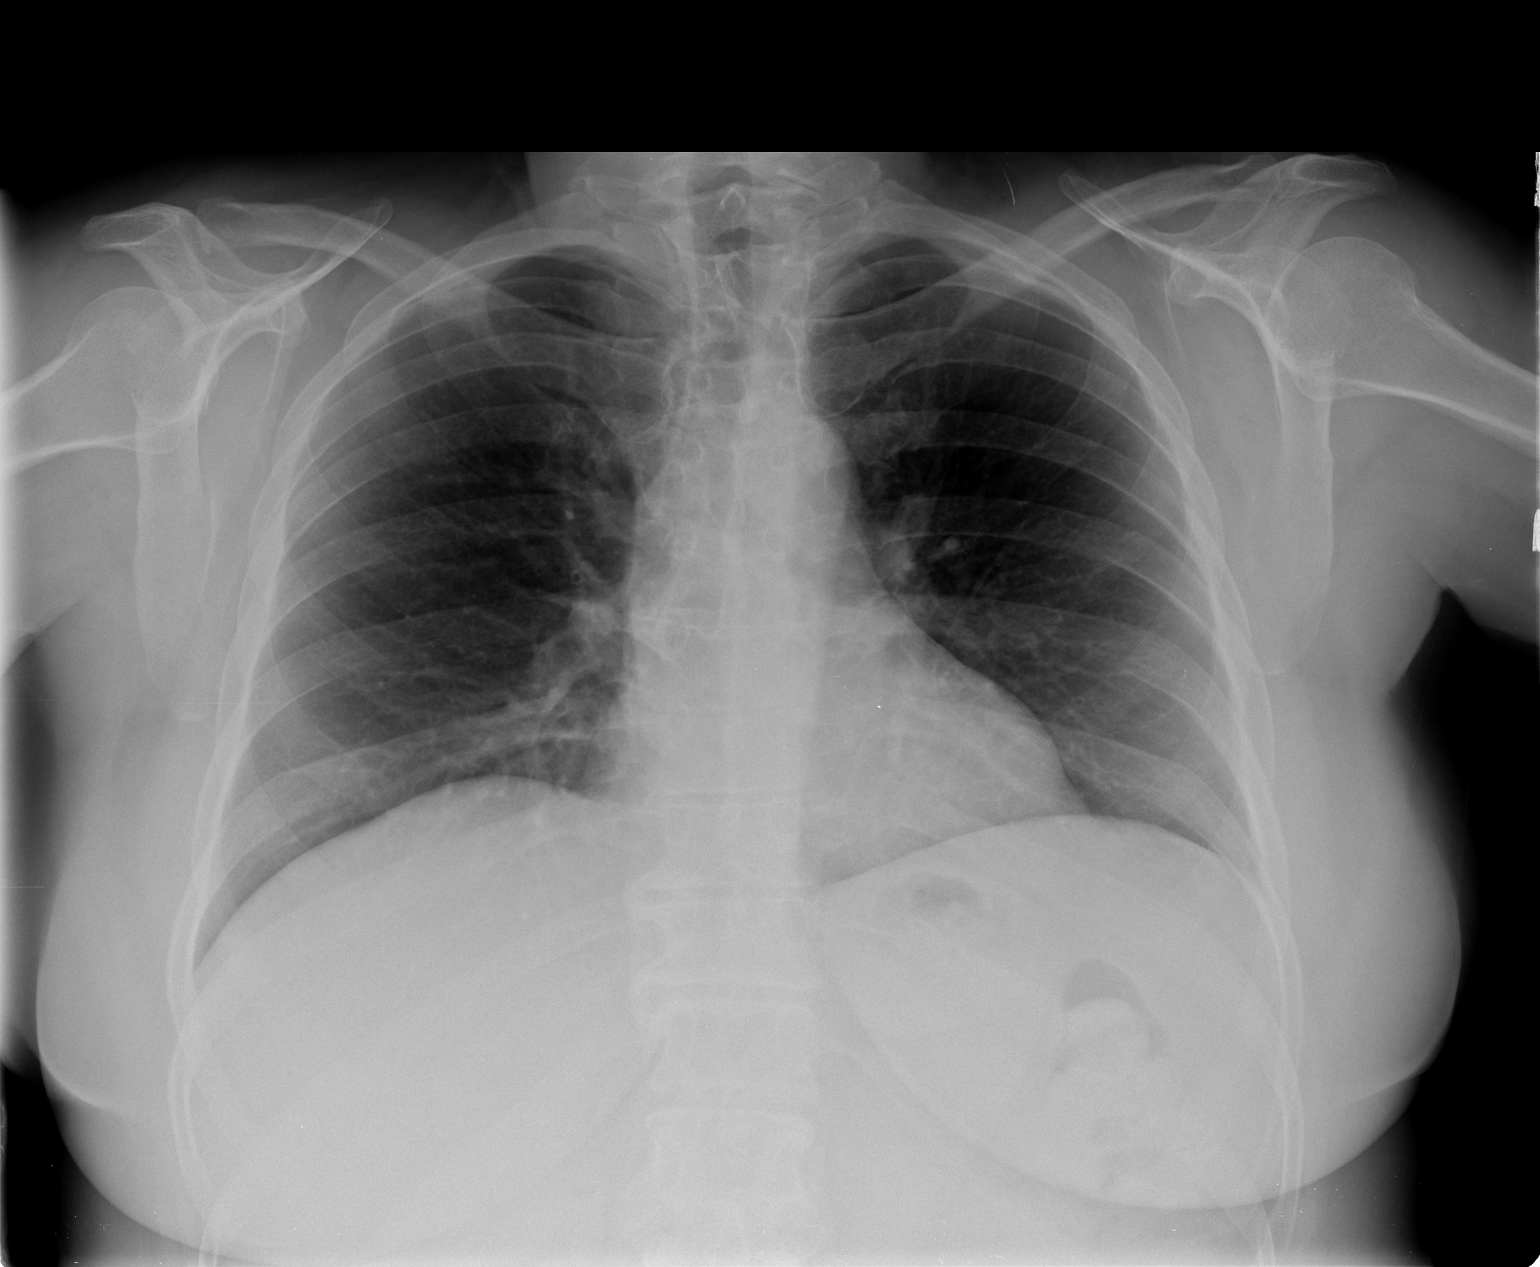

[view not recorded (2 of 2)]
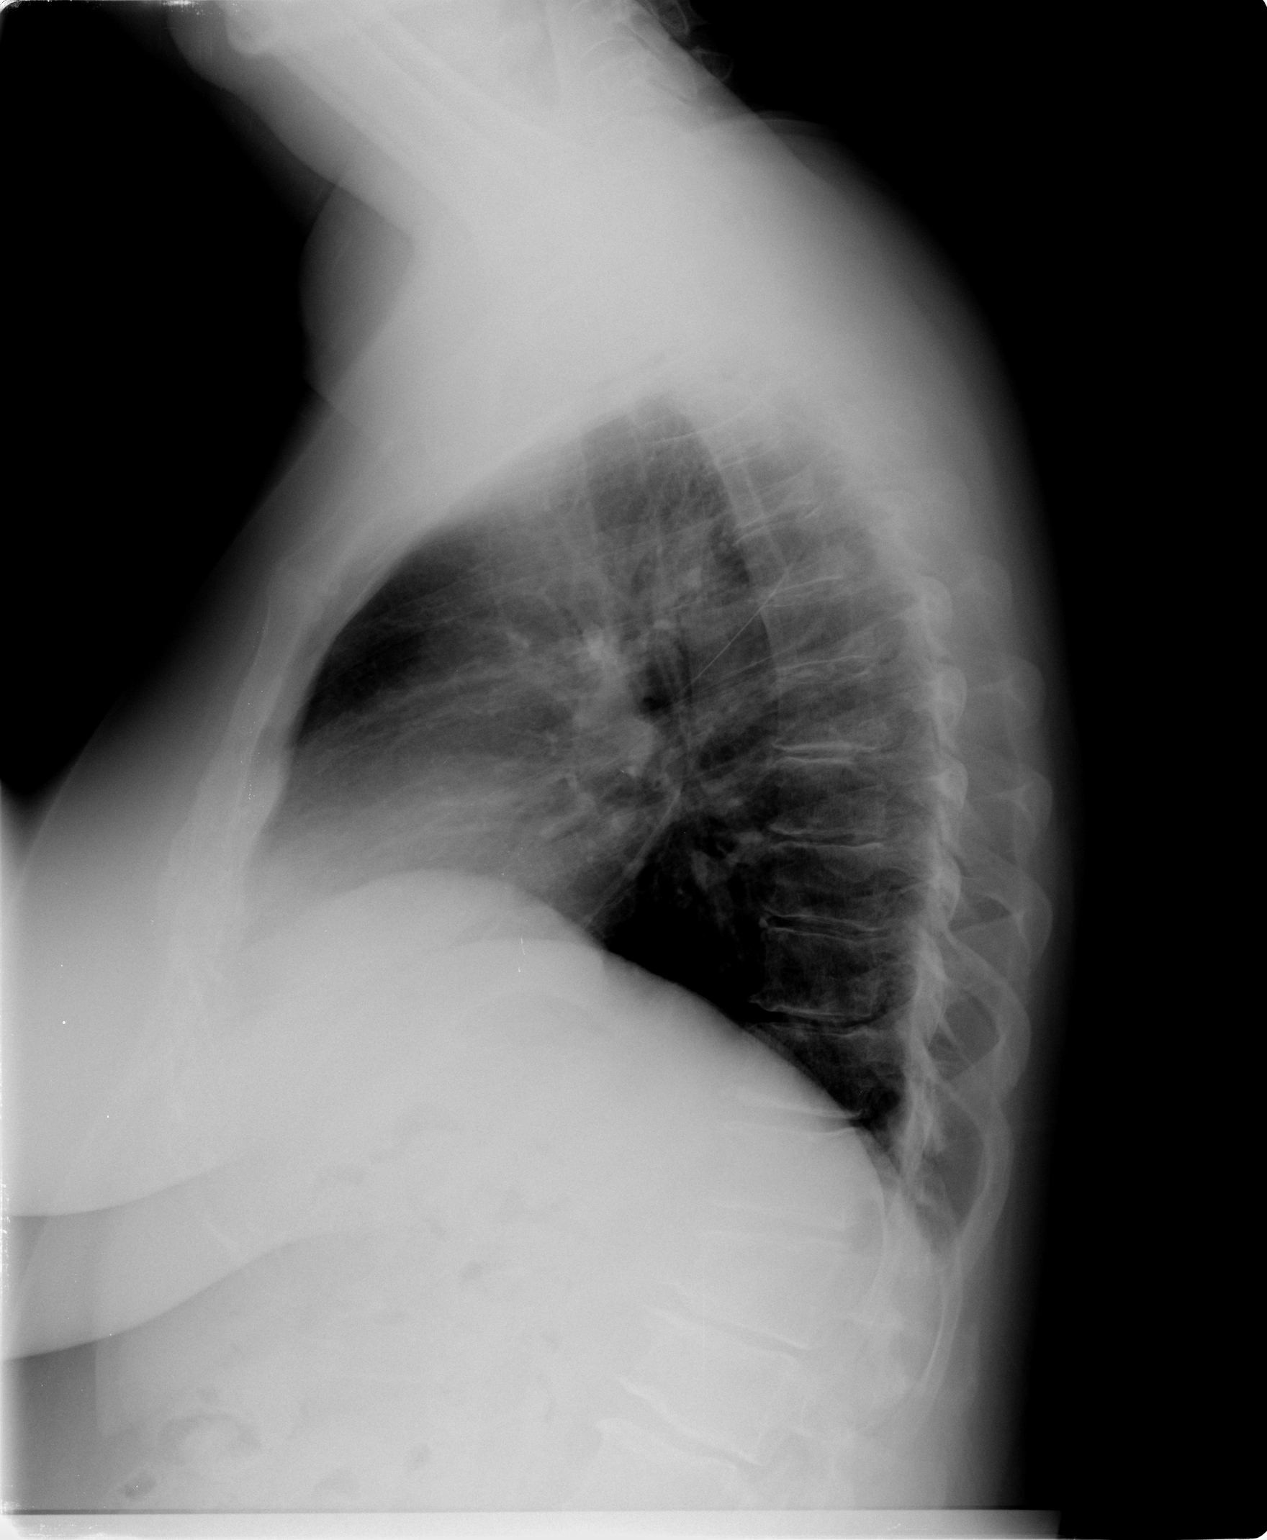

[2 of 2 positions shown; findings below may reference images not displayed]

FINDINGS: Normal mediastinum and cardiac silhouette.  Normal
pulmonary  vasculature.  No evidence of effusion, infiltrate, or
pneumothorax.  No acute bony abnormality. Degenerative
osteophytosis of the thoracic spine.
IMPRESSION: No acute cardiopulmonary process.

## 2012-09-15 IMAGING — CR DG CERVICAL SPINE 2 OR 3 VIEWS
1 series · 1 of 1 positions shown · non-contrast
Comparison: None.

CLINICAL DATA: Cervical disc disease.

CERVICAL SPINE - 2-3 VIEW

[view not recorded]
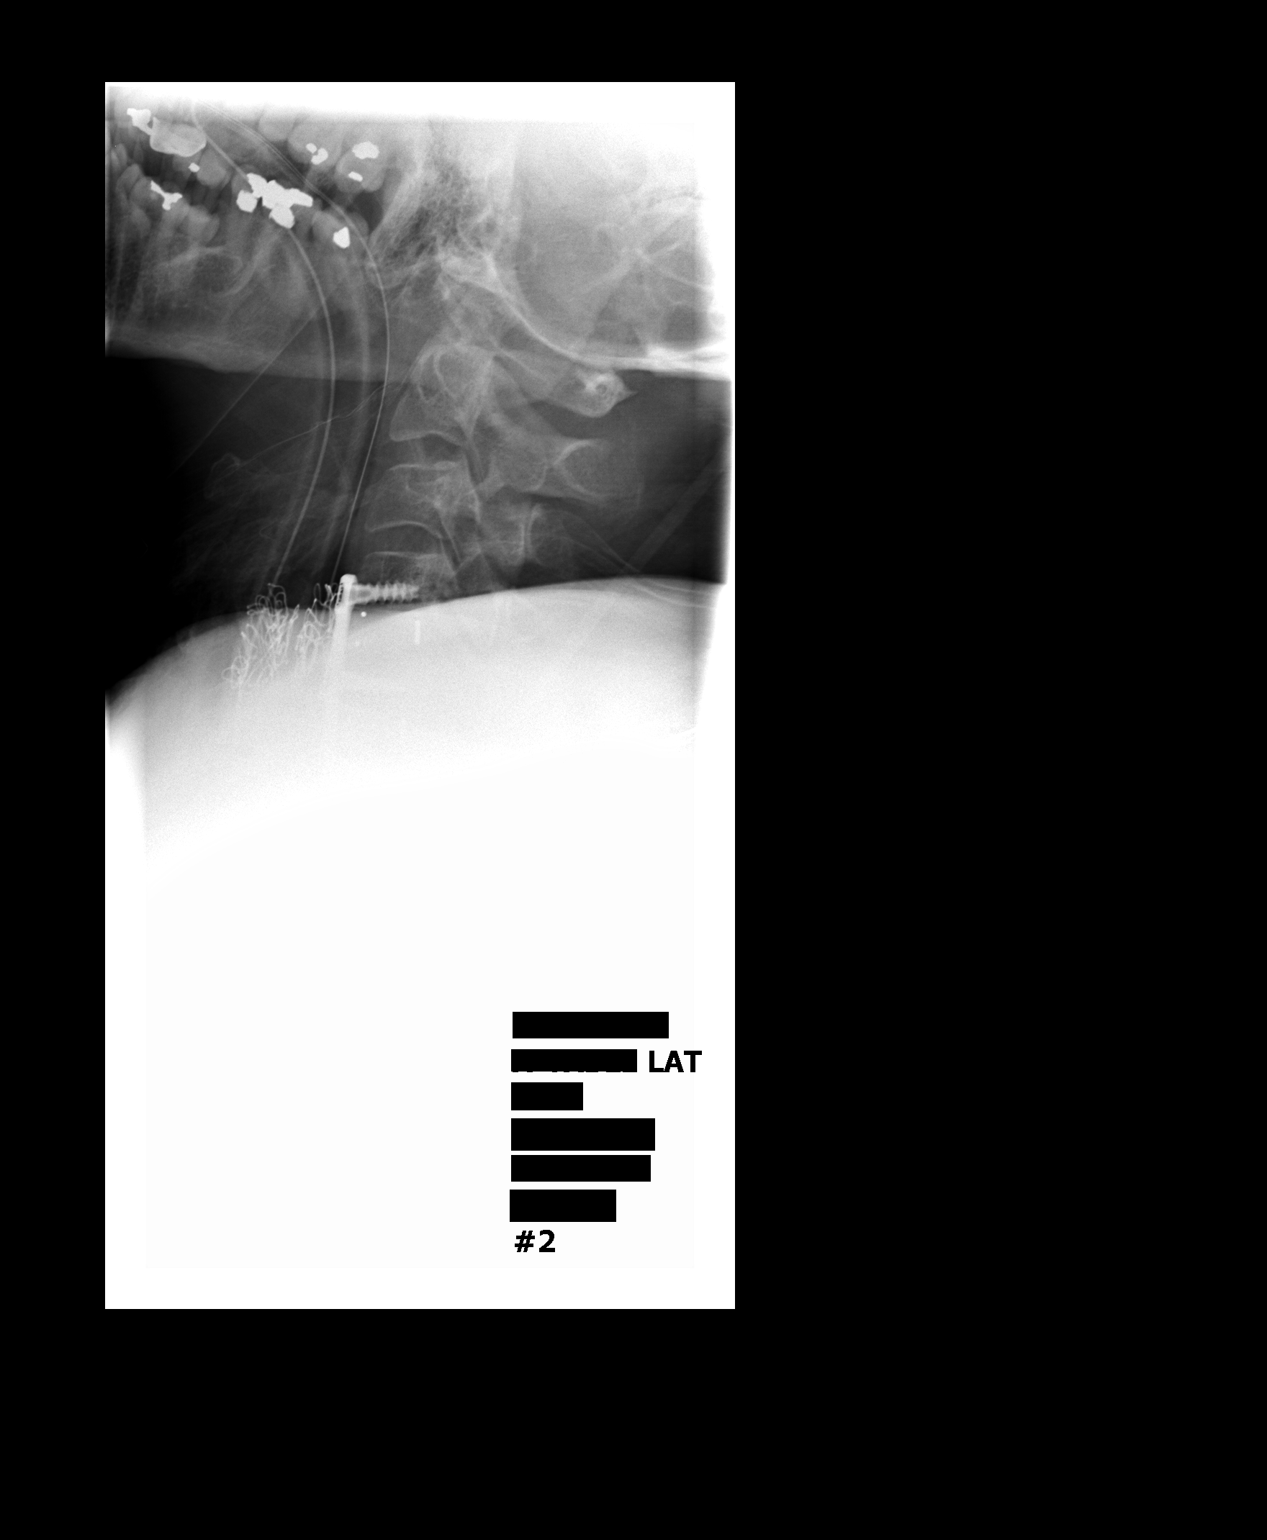

[1 of 1 positions shown; findings below may reference images not displayed]

FINDINGS: Radiograph #1 shows localization needles at the C3-4 C4-5
levels.  Radiograph #2 demonstrates the patient has undergone
anterior cervical fusion at C4-5 and C5-6.
IMPRESSION: Anterior cervical fusions performed at C4-5 and C5-6.

## 2013-01-22 ENCOUNTER — Other Ambulatory Visit: Payer: Self-pay | Admitting: Neurosurgery

## 2013-01-22 DIAGNOSIS — M5416 Radiculopathy, lumbar region: Secondary | ICD-10-CM

## 2013-01-30 ENCOUNTER — Other Ambulatory Visit: Payer: BC Managed Care – PPO

## 2020-04-10 DIAGNOSIS — R0789 Other chest pain: Secondary | ICD-10-CM | POA: Diagnosis not present

## 2022-10-26 ENCOUNTER — Other Ambulatory Visit: Payer: Self-pay

## 2022-10-26 ENCOUNTER — Ambulatory Visit (INDEPENDENT_AMBULATORY_CARE_PROVIDER_SITE_OTHER): Payer: Medicare PPO | Admitting: Internal Medicine

## 2022-10-26 ENCOUNTER — Encounter: Payer: Self-pay | Admitting: Internal Medicine

## 2022-10-26 VITALS — BP 112/70 | HR 84 | Temp 98.1°F | Resp 20 | Ht 63.25 in | Wt 142.1 lb

## 2022-10-26 DIAGNOSIS — J310 Chronic rhinitis: Secondary | ICD-10-CM

## 2022-10-26 DIAGNOSIS — L2389 Allergic contact dermatitis due to other agents: Secondary | ICD-10-CM

## 2022-10-26 DIAGNOSIS — L503 Dermatographic urticaria: Secondary | ICD-10-CM | POA: Diagnosis not present

## 2022-10-26 DIAGNOSIS — T782XXA Anaphylactic shock, unspecified, initial encounter: Secondary | ICD-10-CM

## 2022-10-26 MED ORDER — EPINEPHRINE 0.3 MG/0.3ML IJ SOAJ: 0.3000 mg | INTRAMUSCULAR | 5 refills | Status: AC | PRN

## 2022-10-26 MED ORDER — EPINEPHRINE 0.3 MG/0.3ML IJ SOAJ: 0.3000 mg | INTRAMUSCULAR | 5 refills | Status: DC | PRN

## 2022-10-26 MED ORDER — IPRATROPIUM BROMIDE 0.06 % NA SOLN: 2.0000 | Freq: Four times a day (QID) | NASAL | 12 refills | Status: DC

## 2022-10-26 MED ORDER — IPRATROPIUM BROMIDE 0.06 % NA SOLN: 2.0000 | Freq: Four times a day (QID) | NASAL | 12 refills | Status: AC

## 2022-10-26 NOTE — Progress Notes (Signed)
NEW PATIENT Date of Service/Encounter:  10/26/22 Referring provider: none-self referred Primary care provider: Lorelei Pont, DO  Subjective:  Beverly Harrison is a 62 y.o. female with a PMHx of lichen sclerosis  presenting today for evaluation of rash, rhinitis and venom allergy  History obtained from: chart review and patient.   Discussed the use of AI scribe software for clinical note transcription with the patient, who gave verbal consent to proceed.  History of Present Illness   The patient presents with a history of multiple allergic reactions and a persistent runny nose that started in March. She has not previously experienced these symptoms. The nasal discharge is clear and occurs daily, causing skin peeling around the nose which is managed with Neosporin. The patient has not used any over-the-counter or prescription medications for this issue. She also reports insomnia.  In addition to the nasal symptoms, the patient has been dealing with eye allergies for about ten years, managed with Astelin eye drops used year-round. She has not identified any specific triggers for these symptoms.  The patient also reports a recurrent rash that she initially noticed earlier this year. The rash comes and goes, leaving a small scar. The rash seemed to improve with Benadryl use after a fire ant bite incident. However, the rash became red again after a recent meal, causing the patient to suspect a food allergy. Also reports dermatographism.  The patient also reports a history of allergic reactions to deodorant, which started about two years ago, causing itching in both armpits. She has since switched to hypoallergenic deodorants and reduced usage, which has helped manage the symptoms.  Two years ago, the patient experienced a severe reaction to a wasp sting, resulting in significant swelling of the hand. She was advised to seek medical attention and was treated with prednisone  for a week. The patient  vaguely recalls feeling lightheaded or dizzy during this incident. She does not have an  epipen and has not been evaluated by an allergist. No subsequent field stings         Other allergy screening: Asthma: no Rhino conjunctivitis: yes Food allergy: no Medication allergy: no Hymenoptera allergy: yes Urticaria:  chronic pruritus  Eczema:no History of recurrent infections suggestive of immunodeficency: no Vaccinations are up to date.   Past Medical History: Past Medical History:  Diagnosis Date   Arthritis    Chronic kidney disease    Stone   Complication of anesthesia    Difficulty breathing. (2006) Hyotension (2011)   Constipation    Diabetes mellitus    Hypertension    Hypothyroidism    Insomnia    Lichen sclerosus    Neuromuscular disorder (HCC)    Vascular abnormality    "vascular Flow"  problem. caused me to get nauseous and have a terrible headache when they were trying to fit me with cervical traction."   Medication List:  Current Outpatient Medications  Medication Sig Dispense Refill   ALPRAZolam (XANAX) 0.5 MG tablet Take 0.5 mg by mouth 3 (three) times daily as needed. For anxiety     aspirin EC 81 MG tablet Take 81 mg by mouth daily.     atenolol (TENORMIN) 50 MG tablet Take 25-50 mg by mouth daily. Take 1 tablet in the morning and 0.5 tablet in the evening.     B Complex Vitamins (VITAMIN B COMPLEX) TABS Take 1 tablet by mouth daily.     Coenzyme Q10 (CO Q 10 PO) Take 1 capsule by mouth daily.  glyBURIDE (DIABETA) 1.25 MG tablet Take 1.25 mg by mouth every evening.     levothyroxine (SYNTHROID, LEVOTHROID) 125 MCG tablet Take 125 mcg by mouth daily.     linagliptin (TRADJENTA) 5 MG TABS tablet Take 5 mg by mouth every evening.     losartan-hydrochlorothiazide (HYZAAR) 100-12.5 MG per tablet Take 1 tablet by mouth every evening.     metFORMIN (GLUMETZA) 500 MG (MOD) 24 hr tablet Take 500 mg by mouth 2 (two) times daily with a meal.     Multiple Vitamin  (MULITIVITAMIN WITH MINERALS) TABS Take 1 tablet by mouth daily.     omega-3 acid ethyl esters (LOVAZA) 1 G capsule Take 1 g by mouth daily.     pravastatin (PRAVACHOL) 10 MG tablet Take 10 mg by mouth at bedtime.     zolpidem (AMBIEN) 5 MG tablet Take 5 mg by mouth at bedtime as needed. For sleep     EPINEPHrine 0.3 mg/0.3 mL IJ SOAJ injection Inject 0.3 mg into the muscle as needed for anaphylaxis. 1 each 5   ipratropium (ATROVENT) 0.06 % nasal spray Place 2 sprays into both nostrils 4 (four) times daily. 15 mL 12   No current facility-administered medications for this visit.   Known Allergies:  Allergies  Allergen Reactions   Hydrocodone-Acetaminophen Other (See Comments)    constipation   Doxycycline Rash    sunlight sensitive   Past Surgical History: Past Surgical History:  Procedure Laterality Date   ABDOMINAL HYSTERECTOMY     ANTERIOR CERVICAL DECOMP/DISCECTOMY FUSION  04/09/2011   Procedure: ANTERIOR CERVICAL DECOMPRESSION/DISCECTOMY FUSION 2 LEVELS;  Surgeon: Maeola Harman, MD;  Location: [facility];  Service: Neurosurgery;  Laterality: N/A;  Cervical four-five Cervical five-six anterior cervical decompression and fusion with interbody prothesis plating and bonegraft   KIDNEY STONE SURGERY     Cystoscopy   KNEE ARTHROSCOPY  2008   Left   Vaginal Mesh, Bladder sling  2011   Family History: Family History  Problem Relation Age of Onset   Anesthesia problems Mother    Social History: Beverly Harrison lives single-family home that is 60 months old.  No roaches in the house and bed is to be off the floor.  Does not precautions on bed and pillows.  Not exposed to fumes, chemicals or dust.  HEPA filter in the home and home is not near an [place] she is retired, no smoke exposure, no animals in the home  ROS:  All other systems negative except as noted per HPI.  Objective:  Blood pressure 112/70, pulse 84, temperature 98.1 F (36.7 C), temperature source Temporal,  resp. rate 20, height 5' 3.25" (1.607 m), weight 142 lb 1.6 oz (64.5 kg), SpO2 97%. Body mass index is 24.97 kg/m. Physical Exam:  General Appearance:  Alert, cooperative, no distress, appears stated age  Head:  Normocephalic, without obvious abnormality, atraumatic  Eyes:  Conjunctiva clear, EOM's intact  Ears normal TMs bilaterally  Nose: Nares normal,  clear rhinnorhea and normal mucosa  Throat: Lips, tongue normal; teeth and gums normal, normal posterior oropharynx  Neck: Supple, symmetrical  Lungs:   clear to auscultation bilaterally, Respirations unlabored, no coughing  Heart:  regular rate and rhythm, Appears well perfused  Extremities: No edema  Skin: Skin color, texture, turgor normal  Neurologic: No gross deficits   Diagnostics: Will return for skin testing   Labs:  Lab Orders         Chronic Urticaria  Tryptase         TSH         Anti-TPO Ab (RDL)         CMP14+EGFR         C-reactive protein         Alpha-Gal Panel         CBC w/Diff/Platelet         Hymenoptera Venom Allergy II       Assessment and Plan  Assessment and Plan Suspect multiple allergic medical problems going on to include chronic dermatographic urticaria, contact dermatitis, chronic rhinoconjunctivitis and wasp allergy.  Less suspicion for food allergy.  Will have her return for skin testing environmentals and common foods.  And then follow-up for patch testing.    Chronic Rhinitis Chronic clear rhinorrhea since March. No prior history. No known triggers. No response to over-the-counter medications. -Start Nasal Atrovent 0.06% as needed up to four times a day.  Allergic Conjunctivitis Chronic symptoms for approximately ten years. Using Xelassine eye drops year-round. -Continue Astelin  eye drops as prescribed.  Chronic Urticaria Chronic rash with unknown trigger. Rash comes and goes, and is present most of the time. Improvement noted with Benadryl. -Order labs to investigate  underlying causes.  TSH, thyroid antibodies, tryptase, chronic urticaria index, alpha gal, CBC with differential, CMP, -Start Zyrtec 10 mg 1-2 times daily for prevention  Contact Dermatitis History of allergic reactions to deodorant. Currently using hypoallergenic Almay and Dritho. -Consider patch testing after four weeks off topical steroids.  Wasp Venom Allergy History of severe local reaction to wasp sting two years ago. No systemic symptoms reported. -Order labs to confirm allergy. -Prescribe EpiPen and provide an action plan. - Start avoidance measures to venom as below    Follow-up -Return for allergy testing. -Check labs for wasp venom allergy and underlying causes of chronic urticaria. -Consider patch testing for contact dermatitis and chronic urticaria after four weeks off topical steroids.    Thank you so much for letting me partake in your care today.  Don't hesitate to reach out if you have any additional concerns!  [name], MD  [facility] [place] [place]        This note in its entirety was forwarded to the Provider who requested this consultation.  Other: none   Thank you for your kind referral. I appreciate the opportunity to take part in Beverly Harrison's care. Please do not hesitate to contact me with questions.  Sincerely,  Follow up:  for skin testing   Thank you so much for letting me partake in your care today.  Don't hesitate to reach out if you have any additional concerns!  [name], MD  [facility] [place], [place]

## 2022-10-26 NOTE — Patient Instructions (Addendum)
Chronic Rhinitis Chronic clear rhinorrhea since March. No prior history. No known triggers. No response to over-the-counter medications. -Start Nasal Atrovent 0.06% as needed up to four times a day.  Allergic Conjunctivitis Chronic symptoms for approximately ten years. Using Xelassine eye drops year-round. -Continue Astelin  eye drops as prescribed.  Chronic Urticaria Chronic rash with unknown trigger. Rash comes and goes, and is present most of the time. Improvement noted with Benadryl. -Order labs to investigate underlying causes.  TSH, thyroid antibodies, tryptase, chronic urticaria index, alpha gal, CBC with differential, CMP, -Start Zyrtec 10 mg 1-2 times daily for prevention  Contact Dermatitis History of allergic reactions to deodorant. Currently using hypoallergenic Almay and Dritho. -Consider patch testing after four weeks off topical steroids.  Wasp Venom Allergy History of severe local reaction to wasp sting two years ago. No systemic symptoms reported. -Order labs to confirm allergy. -Prescribe EpiPen and provide an action plan. - Start avoidance measures to venom as below .  Follow-up -Return for allergy testing. -Check labs for wasp venom allergy and underlying causes of chronic urticaria. -Consider patch testing for contact dermatitis and chronic urticaria after four weeks off topical steroids.    Thank you so much for letting me partake in your care today.  Don't hesitate to reach out if you have any additional concerns!  [name], MD  [facility] [place], [place]

## 2022-10-29 LAB — HYMENOPTERA VENOM ALLERGY II
Bumblebee: <0.1 kU/L
Hornet, White Face, IgE: <0.1 kU/L
Hornet, Yellow, IgE: <0.1 kU/L
I001-IgE Honeybee: <0.1 kU/L
I003-IgE Yellow Jacket: <0.1 kU/L
I004-IgE Paper Wasp: 0.49 kU/L — AB
I208-IgE Api m 1: <0.1 kU/L
I209-IgE Ves v 5: <0.1 kU/L
I210-IgE Pol d 5: 0.19 kU/L — AB
I211-IgE Ves v 1: <0.1 kU/L
I214-IgE Api m 2: <0.1 kU/L
I215-IgE Api m 3: <0.1 kU/L
I216-IgE Api m 5: <0.1 kU/L
I217-IgE Api m 10: <0.1 kU/L
Tryptase: 2.1 ug/L — ABNORMAL LOW (ref 2.2–13.2)

## 2022-10-29 LAB — CBC WITH DIFFERENTIAL/PLATELET
Basophils Absolute: 0.1 10*3/uL (ref 0.0–0.2)
Basos: 1 %
EOS (ABSOLUTE): 0.2 10*3/uL (ref 0.0–0.4)
Eos: 3 %
Hematocrit: 46 % (ref 34.0–46.6)
Hemoglobin: 15 g/dL (ref 11.1–15.9)
Immature Grans (Abs): 0 10*3/uL (ref 0.0–0.1)
Immature Granulocytes: 0 %
Lymphocytes Absolute: 2.1 10*3/uL (ref 0.7–3.1)
Lymphs: 34 %
MCH: 28.9 pg (ref 26.6–33.0)
MCHC: 32.6 g/dL (ref 31.5–35.7)
MCV: 89 fL (ref 79–97)
Monocytes Absolute: 0.5 10*3/uL (ref 0.1–0.9)
Monocytes: 8 %
Neutrophils Absolute: 3.3 10*3/uL (ref 1.4–7.0)
Neutrophils: 54 %
Platelets: 280 10*3/uL (ref 150–450)
RBC: 5.19 x10E6/uL (ref 3.77–5.28)
RDW: 13.9 % (ref 11.7–15.4)
WBC: 6.2 10*3/uL (ref 3.4–10.8)

## 2022-10-29 LAB — ALLERGEN COMPONENT COMMENTS

## 2022-11-01 ENCOUNTER — Ambulatory Visit (INDEPENDENT_AMBULATORY_CARE_PROVIDER_SITE_OTHER): Payer: Medicare PPO | Admitting: Internal Medicine

## 2022-11-01 DIAGNOSIS — J310 Chronic rhinitis: Secondary | ICD-10-CM

## 2022-11-01 NOTE — Progress Notes (Signed)
  Date of Service/Encounter:  11/01/22  Allergy testing appointment   Initial visit on 10/26/22, seen for contact dermatitis, wasp allergy, allergic rhinitis, urticaria .  Please see that note for additional details.  Today reports for allergy diagnostic testing:    DIAGNOSTICS:  Skin Testing: Environmental allergy panel and select foods. Inadequate positive and negative controls due to recent antihistamine use.   Results discussed with patient/family.   Allergy testing results were read and interpreted by myself, documented by clinical staff.  Patient provided with copy of allergy testing along with avoidance measures when indicated.   [name], MD  [facility] [place]

## 2022-11-02 LAB — CMP14+EGFR
ALT: 13 [IU]/L (ref 0–32)
AST: 14 [IU]/L (ref 0–40)
Albumin: 4.3 g/dL (ref 3.9–4.9)
Alkaline Phosphatase: 75 [IU]/L (ref 44–121)
BUN/Creatinine Ratio: 18 (ref 12–28)
BUN: 13 mg/dL (ref 8–27)
Bilirubin Total: <0.2 mg/dL (ref 0.0–1.2)
CO2: 23 mmol/L (ref 20–29)
Calcium: 10.2 mg/dL (ref 8.7–10.3)
Chloride: 96 mmol/L (ref 96–106)
Creatinine, Ser: 0.73 mg/dL (ref 0.57–1.00)
Globulin, Total: 2.4 g/dL (ref 1.5–4.5)
Glucose: 175 mg/dL — ABNORMAL HIGH (ref 70–99)
Potassium: 4 mmol/L (ref 3.5–5.2)
Sodium: 139 mmol/L (ref 134–144)
Total Protein: 6.7 g/dL (ref 6.0–8.5)
eGFR: 92 mL/min/{1.73_m2} (ref >59)

## 2022-11-02 LAB — ANTI-TPO AB (RDL): Anti-TPO Ab (RDL): <9 [IU]/mL (ref <9.0)

## 2022-11-02 LAB — C-REACTIVE PROTEIN: CRP: 1 mg/L (ref 0–10)

## 2022-11-02 LAB — ALPHA-GAL PANEL
Allergen Lamb IgE: <0.1 kU/L
Beef IgE: <0.1 kU/L
IgE (Immunoglobulin E), Serum: 13 [IU]/mL (ref 6–495)
O215-IgE Alpha-Gal: <0.1 kU/L
Pork IgE: <0.1 kU/L

## 2022-11-02 LAB — CHRONIC URTICARIA: cu index: 4.1 (ref <10)

## 2022-11-02 LAB — TSH: TSH: 1.52 u[IU]/mL (ref 0.450–4.500)

## 2022-11-02 LAB — TRYPTASE: Tryptase: 2 ug/L — ABNORMAL LOW (ref 2.2–13.2)

## 2022-11-03 NOTE — Patient Instructions (Incomplete)
Perennial allergic rhinitis Chronic clear rhinorrhea since March. No prior history. No known triggers. No response to over-the-counter medications. -continue  Nasal Atrovent 0.06% as needed up to four times a day. -skin prick testing to environmental allergens and select foods today showed:negative with adequate controls. Intradermal testing is positive to dust mite -Copy of skin test given -Start avoidance measures as below  Allergic Conjunctivitis Chronic symptoms for approximately ten years. Using Xelassine eye drops year-round. -Continue Astelin  eye drops as prescribed.  Chronic Urticaria Chronic rash with unknown trigger. Rash comes and goes, and is present most of the time. Improvement noted with Benadryl. -lab work unrevealing  -continue Zyrtec 10 mg 1-2 times daily for prevention  Contact Dermatitis History of allergic reactions to deodorant. Currently using hypoallergenic Almay and Dritho. 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 No systemic symptoms reported. -lab work on 10/26/22 positive to wasp.  Consider venom skin testing due to symptoms with wasp sting.  We could also consider just doing venom injections to wasp.  We will see what Dr. Marlynn Perking recommends  -Prescribe EpiPen and provide an action plan. - Start avoidance measures to venom as below .  Follow-up  -Keep appointment on 11/15/22 for patch placement. Remember to not take any oral/injectable steroids or use any steroid creams between now and this appointment.       Control of Dust Mite Allergen Dust mites play a major role in allergic asthma and rhinitis. They occur in environments with high humidity wherever human skin is found. Dust mites absorb humidity from the atmosphere (ie, they do not drink) and feed on organic matter (including shed human and animal skin). Dust mites are a microscopic type of insect that you cannot  see with the naked eye. High levels of dust mites have been detected from mattresses, pillows, carpets, upholstered furniture, bed covers, clothes, soft toys and any woven material. The principal allergen of the dust mite is found in its feces. A gram of dust may contain 1,000 mites and 250,000 fecal particles. Mite antigen is easily measured in the air during house cleaning activities. Dust mites do not bite and do not cause harm to humans, other than by triggering allergies/asthma.  Ways to decrease your exposure to dust mites in your home:  1. Encase mattresses, box springs and pillows with a mite-impermeable barrier or cover  2. Wash sheets, blankets and drapes weekly in hot water (130 F) with detergent and dry them in a dryer on the hot setting.  3. Have the room cleaned frequently with a vacuum cleaner and a damp dust-mop. For carpeting or rugs, vacuuming with a vacuum cleaner equipped with a high-efficiency particulate air (HEPA) filter. The dust mite allergic individual should not be in a room which is being cleaned and should wait 1 hour after cleaning before going into the room.  4. Do not sleep on upholstered furniture (eg, couches).  5. If possible removing carpeting, upholstered furniture and drapery from the home is ideal. Horizontal blinds should be eliminated in the rooms where the person spends the most time (bedroom, study, television room). Washable vinyl, roller-type shades are optimal.  6. Remove all non-washable stuffed toys from the bedroom. Wash stuffed toys weekly like sheets and blankets above.  7. Reduce indoor humidity to less than 50%. Inexpensive humidity monitors can be purchased at most hardware stores. Do not use a humidifier as can make the problem worse  and are not recommended.

## 2022-11-04 ENCOUNTER — Ambulatory Visit (INDEPENDENT_AMBULATORY_CARE_PROVIDER_SITE_OTHER): Payer: Medicare PPO | Admitting: Family

## 2022-11-04 ENCOUNTER — Encounter: Payer: Self-pay | Admitting: Family

## 2022-11-04 DIAGNOSIS — J3089 Other allergic rhinitis: Secondary | ICD-10-CM | POA: Diagnosis not present

## 2022-11-04 DIAGNOSIS — T6391XD Toxic effect of contact with unspecified venomous animal, accidental (unintentional), subsequent encounter: Secondary | ICD-10-CM

## 2022-11-04 DIAGNOSIS — L503 Dermatographic urticaria: Secondary | ICD-10-CM

## 2022-11-04 DIAGNOSIS — L2389 Allergic contact dermatitis due to other agents: Secondary | ICD-10-CM

## 2022-11-04 NOTE — Progress Notes (Signed)
Date of Service/Encounter:  11/04/22  Allergy testing appointment   Initial visit on October 26, 2022, seen for chronic rhinitis, allergic conjunctivitis, chronic urticaria, contact dermatitis, and wasp allergy.  Please see that note for additional details.  She reports this Saturday she got stung on her left arm by a yellowjacket and had localized swelling.  She took 2 Benadryl.  She denies any concomitant cardiorespiratory or gastrointestinal symptoms.  Today reports for allergy diagnostic testing:    DIAGNOSTICS:  Skin Testing: Environmental allergens and select foods.. Adequate positive and negative controls Results discussed with patient/family.    Airborne Adult Perc - 11/04/22 1353     Time Antigen Placed 0220    Allergen Manufacturer Waynette Buttery    Location Back    Number of Test 55    Panel 1 --    1. Control-Buffer 50% Glycerol Negative   Simultaneous filing. User may not have seen previous data.   2. Control-Histamine 3+   Simultaneous filing. User may not have seen previous data.   3. [place] Negative   Simultaneous filing. User may not have seen previous data.   4. [place] Negative   Simultaneous filing. User may not have seen previous data.   5. Johnson Negative   Simultaneous filing. User may not have seen previous data.   6. [facility] Negative   Simultaneous filing. User may not have seen previous data.   7. Meadow Fescue Negative   Simultaneous filing. User may not have seen previous data.   8. Perennial Rye Negative   Simultaneous filing. User may not have seen previous data.   9. Timothy Negative   Simultaneous filing. User may not have seen previous data.   10. Ragweed Mix Negative   Simultaneous filing. User may not have seen previous data.   11. Cocklebur Negative   Simultaneous filing. User may not have seen previous data.   12. Plantain,  English Negative   Simultaneous filing. User may not have seen previous data.   13. Baccharis Negative   Simultaneous  filing. User may not have seen previous data.   14. Dog Fennel Negative   Simultaneous filing. User may not have seen previous data.   15. Russian Thistle Negative   Simultaneous filing. User may not have seen previous data.   16. Lamb's Quarters Negative   Simultaneous filing. User may not have seen previous data.   17. Sheep Sorrell Negative   Simultaneous filing. User may not have seen previous data.   18. Rough Pigweed Negative   Simultaneous filing. User may not have seen previous data.   19. Marsh Elder, Rough Negative   Simultaneous filing. User may not have seen previous data.   20. Mugwort, Common Negative   Simultaneous filing. User may not have seen previous data.   21. Box, Elder Negative   Simultaneous filing. User may not have seen previous data.   22. Cedar, red Negative   Simultaneous filing. User may not have seen previous data.   23. Sweet Gum Negative   Simultaneous filing. User may not have seen previous data.   24. Pecan Pollen Negative   Simultaneous filing. User may not have seen previous data.   25. Pine Mix Negative   Simultaneous filing. User may not have seen previous data.   26. Walnut, Black Pollen Negative   Simultaneous filing. User may not have seen previous data.   27. Red Mulberry Negative   Simultaneous filing. User may not have seen previous data.  28. Ash Mix Negative   Simultaneous filing. User may not have seen previous data.   29Charletta Cousin Mix Negative   Simultaneous filing. User may not have seen previous data.   30. Beech American Negative   Simultaneous filing. User may not have seen previous data.   31. Cottonwood, Eastern Negative   Simultaneous filing. User may not have seen previous data.   32. Hickory, White Negative   Simultaneous filing. User may not have seen previous data.   33. Maple Mix Negative   Simultaneous filing. User may not have seen previous data.   34. Oak, Eastern Mix Negative   Simultaneous filing. User may not have seen previous  data.   35. Sycamore Eastern Negative   Simultaneous filing. User may not have seen previous data.   36. Alternaria Alternata Negative   Simultaneous filing. User may not have seen previous data.   37. Cladosporium Herbarum Negative   Simultaneous filing. User may not have seen previous data.   38. Aspergillus Mix Negative   Simultaneous filing. User may not have seen previous data.   39. Penicillium Mix Negative   Simultaneous filing. User may not have seen previous data.   40. Bipolaris Sorokiniana (Helminthosporium) Negative   Simultaneous filing. User may not have seen previous data.   41. Drechslera Spicifera (Curvularia) Negative   Simultaneous filing. User may not have seen previous data.   42. Mucor Plumbeus Negative   Simultaneous filing. User may not have seen previous data.   43. Fusarium Moniliforme Negative   Simultaneous filing. User may not have seen previous data.   44. Aureobasidium Pullulans (pullulara) Negative   Simultaneous filing. User may not have seen previous data.   45. Rhizopus Oryzae Negative   Simultaneous filing. User may not have seen previous data.   46. Botrytis Cinera Negative   Simultaneous filing. User may not have seen previous data.   47. Epicoccum Nigrum Negative   Simultaneous filing. User may not have seen previous data.   48. Phoma Betae Negative   Simultaneous filing. User may not have seen previous data.   49. Dust Mite Mix Negative   Simultaneous filing. User may not have seen previous data.   50. Cat Hair 10,000 BAU/ml Negative   Simultaneous filing. User may not have seen previous data.   51.  Dog Epithelia Negative   Simultaneous filing. User may not have seen previous data.   52. Mixed Feathers Negative   Simultaneous filing. User may not have seen previous data.   53. Horse Epithelia Negative   Simultaneous filing. User may not have seen previous data.   54. Cockroach, German Negative   Simultaneous filing. User may not have seen previous data.    55. Tobacco Leaf Negative   Simultaneous filing. User may not have seen previous data.   1. Metallurgist. User may not have seen previous data.   2. Other Omitted    3. Other Omitted             13 Food Perc - 11/04/22 1400       Test Information   Time Antigen Placed 1191    Allergen Manufacturer Waynette Buttery    Location Back    Number of allergen test 13      Food   1. Peanut Negative    2. Soybean Negative    3. Wheat Negative    4. Sesame Negative    5. Milk, Cow Negative  6. Casein Negative    7. Egg White, Chicken Negative    8. Shellfish Mix Negative    9. Fish Mix Negative    10. Cashew Negative    11. Walnut Food Negative    12. Almond Negative    13. Hazelnut Negative             Intradermal - 11/04/22 1440     Time Antigen Placed 1440    Allergen Manufacturer Greer    Location Arm    Number of Test 16    Control 3+    [place] Negative    [place] Negative    Johnson Negative    7 Grass Negative    Ragweed Mix Negative    Weed Mix Negative    Tree Mix Negative    Mold 1 Negative    Mold 2 Negative    Mold 3 Negative    Mold 4 Negative    Mite Mix 3+    Cat Negative    Dog Negative    Cockroach Negative             Food Adult Perc - 11/04/22 1300     Time Antigen Placed 0200    Allergen Manufacturer Waynette Buttery    Location Back    Number of allergen test 13              Allergy testing results were read and interpreted by myself, documented by clinical staff.  Patient provided with copy of allergy testing along with avoidance measures when indicated.    Perennial allergic rhinitis Chronic clear rhinorrhea since March. No prior history. No known triggers. No response to over-the-counter medications. -continue  Nasal Atrovent 0.06% as needed up to four times a day. -skin prick testing to environmental allergens and select foods today showed:negative with adequate controls. Intradermal testing is positive to  dust mite -Copy of skin test given -Start avoidance measures as below  Allergic Conjunctivitis Chronic symptoms for approximately ten years. Using Xelassine eye drops year-round. -Continue Astelin  eye drops as prescribed.  Chronic Urticaria Chronic rash with unknown trigger. Rash comes and goes, and is present most of the time. Improvement noted with Benadryl. -lab work unrevealing  -continue Zyrtec 10 mg 1-2 times daily for prevention  Contact Dermatitis History of allergic reactions to deodorant. Currently using hypoallergenic Almay and Dritho. 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 No systemic symptoms reported. -lab work on 10/26/22 positive to wasp.  Consider venom skin testing due to symptoms with wasp sting.  We could also consider just doing venom injections to wasp.  We will see what Dr. Marlynn Perking recommends  -Prescribe EpiPen and provide an action plan. - Start avoidance measures to venom as below .  Follow-up  -Keep appointment on 11/15/22 for patch placement. Remember to not take any oral/injectable steroids or use any steroid creams between now and this appointment.       Control of Dust Mite Allergen Dust mites play a major role in allergic asthma and rhinitis. They occur in environments with high humidity wherever human skin is found. Dust mites absorb humidity from the atmosphere (ie, they do not drink) and feed on organic matter (including shed human and animal skin). Dust mites are a microscopic type of insect that you cannot see with the naked eye. High levels of dust mites have been detected from mattresses, pillows, carpets, upholstered furniture, bed covers,  clothes, soft toys and any woven material. The principal allergen of the dust mite is found in its feces. A gram of dust may contain 1,000 mites and 250,000 fecal particles. Mite antigen is easily measured in the air during  house cleaning activities. Dust mites do not bite and do not cause harm to humans, other than by triggering allergies/asthma.  Ways to decrease your exposure to dust mites in your home:  1. Encase mattresses, box springs and pillows with a mite-impermeable barrier or cover  2. Wash sheets, blankets and drapes weekly in hot water (130 F) with detergent and dry them in a dryer on the hot setting.  3. Have the room cleaned frequently with a vacuum cleaner and a damp dust-mop. For carpeting or rugs, vacuuming with a vacuum cleaner equipped with a high-efficiency particulate air (HEPA) filter. The dust mite allergic individual should not be in a room which is being cleaned and should wait 1 hour after cleaning before going into the room.  4. Do not sleep on upholstered furniture (eg, couches).  5. If possible removing carpeting, upholstered furniture and drapery from the home is ideal. Horizontal blinds should be eliminated in the rooms where the person spends the most time (bedroom, study, television room). Washable vinyl, roller-type shades are optimal.  6. Remove all non-washable stuffed toys from the bedroom. Wash stuffed toys weekly like sheets and blankets above.  7. Reduce indoor humidity to less than 50%. Inexpensive humidity monitors can be purchased at most hardware stores. Do not use a humidifier as can make the problem worse and are not recommended. Nehemiah Settle, FNP [facility]

## 2022-11-15 ENCOUNTER — Ambulatory Visit (INDEPENDENT_AMBULATORY_CARE_PROVIDER_SITE_OTHER): Payer: Medicare PPO | Admitting: Internal Medicine

## 2022-11-15 ENCOUNTER — Encounter: Payer: Medicare PPO | Admitting: Internal Medicine

## 2022-11-15 DIAGNOSIS — L2389 Allergic contact dermatitis due to other agents: Secondary | ICD-10-CM | POA: Diagnosis not present

## 2022-11-15 NOTE — Progress Notes (Signed)
    Follow-up Note  RE: Beverly Harrison MRN: [id number] DOB: 1960-03-28 Date of Office Visit: 11/15/2022  Primary care provider: Lorelei Pont, DO Referring provider: Lorelei Pont, DO   [name] returns to the office today for the patch test placement, given suspected history of contact dermatitis.    Diagnostics: True Test patches placed.    Plan:   Allergic contact dermatitis - Instructions provided on care of the patches for the next 48 hours. [name] was instructed to avoid showering for the next 48 hours. [name] will follow up in 48 hours and 96 hours for patch readings.    [name], MD [facility]

## 2022-11-17 ENCOUNTER — Ambulatory Visit (INDEPENDENT_AMBULATORY_CARE_PROVIDER_SITE_OTHER): Payer: Medicare PPO | Admitting: Internal Medicine

## 2022-11-17 ENCOUNTER — Encounter: Payer: Medicare PPO | Admitting: Internal Medicine

## 2022-11-17 DIAGNOSIS — L2389 Allergic contact dermatitis due to other agents: Secondary | ICD-10-CM

## 2022-11-17 NOTE — Progress Notes (Signed)
Follow Up Note  RE: Beverly Harrison MRN: [id number] DOB: [birth year] Date of Office Visit: 11/17/2022  Referring provider: Lorelei Pont, DO Primary care provider: Lorelei Pont, DO  History of Present Illness: I had the pleasure of seeing Beverly Harrison for a follow up visit at the [facility] on 11/17/2022. She is a 62 y.o. female, who is being followed for contact dermatitis . Today she is here for initial patch test interpretation, given suspected history of contact dermatitis.   Diagnostics:  TRUE TEST 48 hour reading:   T.R.U.E. Test - 11/17/22 1000       Test Information   Time Antigen Placed 1059   Simultaneous filing. User may not have seen previous data.   Manufacturer Other   [employer]  Simultaneous filing. User may not have seen previous data.   Lot # [id number]   Simultaneous filing. User may not have seen previous data.   Location Back   Simultaneous filing. User may not have seen previous data.   Number of Test 36   Simultaneous filing. User may not have seen previous data.   Reading Interval Day 1;Day 3;Day 5   Simultaneous filing. User may not have seen previous data.   Panel Panel 3;Panel 2;Panel 1   Simultaneous filing. User may not have seen previous data.     Panel 1   1. Nickel Sulfate 0    2. Wool Alcohols 0    3. Neomycin Sulfate 0    4. Potassium Dichromate --   +/-   5. Caine Mix 0    6. Fragrance Mix --   +/-   7. Colophony 0    8. Paraben Mix 0    9. Negative Control 0    10. Balsam of [place] 0    11. Ethylenediamine Dihydrochloride 0    12. Cobalt Dichloride 0      Panel 2   13. p-tert Butylphenol Formaldehyde Resin 0    14. Epoxy Resin 0    15. Carba Mix 0    16.  Black Rubber Mix 0    17. Cl+ Me-Isothiazolinone 0    18. Quaternium-15 0    19. Methyldibromo Glutaronitrile 0    20. p-Phenylenediamine 0    21. Formaldehyde 0    22. Mercapto Mix 0    23. Thimerosal 0    24. Thiuram Mix 0      Panel 3   25. Diazolidinyl Urea  0    26. Quinoline Mix 0    27. Tixocortol-21-Pivalate 0    28. Gold Sodium Thiosulfate 0    29. Imidazolidinyl Urea 0    30. Budesonide 0    31. Hydrocortisone-17-Butyrate 0    32. Mercaptobenzothiazole 0    33. Bacitracin 0    34. Parthenolide 0    35. Disperse Blue 106 0    36. 2-Bromo-2-Nitropropane-1,3-diol 0              Assessment and Plan: Beverly Harrison is a 62 y.o. female with: Concern for Contact Dermatitis:  The patient has been provided detailed information regarding the substances she is sensitive to, as well as products containing the substances.  Meticulous avoidance of these substances is recommended. If avoidance is not possible, the use of barrier creams or lotions is recommended. If symptoms persist or progress despite meticulous avoidance of chemicals/substances above, dermatology evaluation may be warranted. No follow-ups on file.  It was my pleasure to see Beverly Harrison today and participate  in her care. Please feel free to contact me with any questions or concerns.  Sincerely,   [name], MD [facility]

## 2022-11-19 ENCOUNTER — Encounter: Payer: Medicare PPO | Admitting: Family

## 2022-11-19 ENCOUNTER — Encounter: Payer: Self-pay | Admitting: Family

## 2022-11-19 ENCOUNTER — Ambulatory Visit (INDEPENDENT_AMBULATORY_CARE_PROVIDER_SITE_OTHER): Payer: Medicare PPO | Admitting: Family

## 2022-11-19 DIAGNOSIS — L2389 Allergic contact dermatitis due to other agents: Secondary | ICD-10-CM

## 2022-11-19 NOTE — Progress Notes (Signed)
Beverly Harrison returns to the office today for the final patch test interpretation, given suspected history of contact dermatitis. She reports that she has had dental work and cervical surgery several years ago and wonder if any of those products that she is positive to on patch testing was used.   Diagnostics:   TRUE TEST 96-hour hour reading: Borderline positive to potassium dichromate and gold sodium thiosulfate   Plan:   Allergic contact dermatitis - The patient has been provided detailed information regarding the substances she is sensitive to, as well as products containing the substances.   - Meticulous avoidance of these substances is recommended.  - If avoidance is not possible, the use of barrier creams or lotions is recommended. - If symptoms persist or progress despite meticulous avoidance of potassium dichromate and gold sodium thiosulfate, Dermatology Referral may be warranted.   Nehemiah Settle, FNP

## 2022-11-22 ENCOUNTER — Encounter: Payer: Medicare PPO | Admitting: Internal Medicine

## 2022-11-24 ENCOUNTER — Encounter: Payer: Medicare PPO | Admitting: Internal Medicine

## 2022-11-26 ENCOUNTER — Encounter: Payer: Medicare PPO | Admitting: Family

## 2023-01-31 DIAGNOSIS — N3001 Acute cystitis with hematuria: Secondary | ICD-10-CM | POA: Diagnosis not present

## 2023-02-15 DIAGNOSIS — N3000 Acute cystitis without hematuria: Secondary | ICD-10-CM | POA: Diagnosis not present

## 2023-02-21 DIAGNOSIS — R221 Localized swelling, mass and lump, neck: Secondary | ICD-10-CM | POA: Diagnosis not present

## 2023-03-15 DIAGNOSIS — H52223 Regular astigmatism, bilateral: Secondary | ICD-10-CM | POA: Diagnosis not present

## 2023-03-15 DIAGNOSIS — H5213 Myopia, bilateral: Secondary | ICD-10-CM | POA: Diagnosis not present

## 2023-03-17 DIAGNOSIS — E038 Other specified hypothyroidism: Secondary | ICD-10-CM | POA: Diagnosis not present

## 2023-03-17 DIAGNOSIS — E041 Nontoxic single thyroid nodule: Secondary | ICD-10-CM | POA: Diagnosis not present

## 2023-03-17 DIAGNOSIS — L900 Lichen sclerosus et atrophicus: Secondary | ICD-10-CM | POA: Diagnosis not present

## 2023-03-17 DIAGNOSIS — R09A2 Foreign body sensation, throat: Secondary | ICD-10-CM | POA: Diagnosis not present

## 2023-03-17 DIAGNOSIS — I10 Essential (primary) hypertension: Secondary | ICD-10-CM | POA: Diagnosis not present

## 2023-03-17 DIAGNOSIS — F5101 Primary insomnia: Secondary | ICD-10-CM | POA: Diagnosis not present

## 2023-03-17 DIAGNOSIS — Z780 Asymptomatic menopausal state: Secondary | ICD-10-CM | POA: Diagnosis not present

## 2023-03-17 DIAGNOSIS — E782 Mixed hyperlipidemia: Secondary | ICD-10-CM | POA: Diagnosis not present

## 2023-03-17 DIAGNOSIS — M5030 Other cervical disc degeneration, unspecified cervical region: Secondary | ICD-10-CM | POA: Diagnosis not present

## 2023-03-17 DIAGNOSIS — H04123 Dry eye syndrome of bilateral lacrimal glands: Secondary | ICD-10-CM | POA: Diagnosis not present

## 2023-03-17 DIAGNOSIS — E1165 Type 2 diabetes mellitus with hyperglycemia: Secondary | ICD-10-CM | POA: Diagnosis not present

## 2023-03-17 DIAGNOSIS — E559 Vitamin D deficiency, unspecified: Secondary | ICD-10-CM | POA: Diagnosis not present

## 2023-03-24 DIAGNOSIS — K219 Gastro-esophageal reflux disease without esophagitis: Secondary | ICD-10-CM | POA: Diagnosis not present

## 2023-03-24 DIAGNOSIS — R221 Localized swelling, mass and lump, neck: Secondary | ICD-10-CM | POA: Diagnosis not present

## 2023-04-05 DIAGNOSIS — E782 Mixed hyperlipidemia: Secondary | ICD-10-CM | POA: Diagnosis not present

## 2023-04-05 DIAGNOSIS — I10 Essential (primary) hypertension: Secondary | ICD-10-CM | POA: Diagnosis not present

## 2023-04-05 DIAGNOSIS — E559 Vitamin D deficiency, unspecified: Secondary | ICD-10-CM | POA: Diagnosis not present

## 2023-04-05 DIAGNOSIS — E1165 Type 2 diabetes mellitus with hyperglycemia: Secondary | ICD-10-CM | POA: Diagnosis not present

## 2023-04-12 DIAGNOSIS — K219 Gastro-esophageal reflux disease without esophagitis: Secondary | ICD-10-CM | POA: Diagnosis not present

## 2023-04-12 DIAGNOSIS — R221 Localized swelling, mass and lump, neck: Secondary | ICD-10-CM | POA: Diagnosis not present

## 2023-05-09 DIAGNOSIS — G540 Brachial plexus disorders: Secondary | ICD-10-CM | POA: Diagnosis not present

## 2023-05-09 DIAGNOSIS — I10 Essential (primary) hypertension: Secondary | ICD-10-CM | POA: Diagnosis not present

## 2023-05-09 DIAGNOSIS — E039 Hypothyroidism, unspecified: Secondary | ICD-10-CM | POA: Diagnosis not present

## 2023-05-09 DIAGNOSIS — Z23 Encounter for immunization: Secondary | ICD-10-CM | POA: Diagnosis not present

## 2023-05-09 DIAGNOSIS — E1169 Type 2 diabetes mellitus with other specified complication: Secondary | ICD-10-CM | POA: Diagnosis not present

## 2023-05-09 DIAGNOSIS — Z0000 Encounter for general adult medical examination without abnormal findings: Secondary | ICD-10-CM | POA: Diagnosis not present

## 2023-05-31 DIAGNOSIS — E1165 Type 2 diabetes mellitus with hyperglycemia: Secondary | ICD-10-CM | POA: Diagnosis not present

## 2023-05-31 DIAGNOSIS — E039 Hypothyroidism, unspecified: Secondary | ICD-10-CM | POA: Diagnosis not present

## 2023-05-31 DIAGNOSIS — E210 Primary hyperparathyroidism: Secondary | ICD-10-CM | POA: Diagnosis not present

## 2023-05-31 DIAGNOSIS — Z1330 Encounter for screening examination for mental health and behavioral disorders, unspecified: Secondary | ICD-10-CM | POA: Diagnosis not present

## 2023-05-31 DIAGNOSIS — R1310 Dysphagia, unspecified: Secondary | ICD-10-CM | POA: Diagnosis not present

## 2023-06-02 DIAGNOSIS — R09A2 Foreign body sensation, throat: Secondary | ICD-10-CM | POA: Diagnosis not present

## 2023-06-07 DIAGNOSIS — K2289 Other specified disease of esophagus: Secondary | ICD-10-CM | POA: Diagnosis not present

## 2023-06-07 DIAGNOSIS — K449 Diaphragmatic hernia without obstruction or gangrene: Secondary | ICD-10-CM | POA: Diagnosis not present

## 2023-06-20 DIAGNOSIS — L578 Other skin changes due to chronic exposure to nonionizing radiation: Secondary | ICD-10-CM | POA: Diagnosis not present

## 2023-06-20 DIAGNOSIS — L821 Other seborrheic keratosis: Secondary | ICD-10-CM | POA: Diagnosis not present

## 2023-06-20 DIAGNOSIS — L814 Other melanin hyperpigmentation: Secondary | ICD-10-CM | POA: Diagnosis not present

## 2023-06-20 DIAGNOSIS — L80 Vitiligo: Secondary | ICD-10-CM | POA: Diagnosis not present

## 2023-06-20 DIAGNOSIS — D225 Melanocytic nevi of trunk: Secondary | ICD-10-CM | POA: Diagnosis not present

## 2023-06-28 DIAGNOSIS — H02889 Meibomian gland dysfunction of unspecified eye, unspecified eyelid: Secondary | ICD-10-CM | POA: Diagnosis not present

## 2023-08-03 DIAGNOSIS — M542 Cervicalgia: Secondary | ICD-10-CM | POA: Diagnosis not present

## 2023-08-19 DIAGNOSIS — R09A2 Foreign body sensation, throat: Secondary | ICD-10-CM | POA: Diagnosis not present

## 2023-08-26 ENCOUNTER — Other Ambulatory Visit: Payer: Self-pay | Admitting: Medical Genetics

## 2023-09-02 DIAGNOSIS — H1045 Other chronic allergic conjunctivitis: Secondary | ICD-10-CM | POA: Diagnosis not present

## 2023-09-02 DIAGNOSIS — H02889 Meibomian gland dysfunction of unspecified eye, unspecified eyelid: Secondary | ICD-10-CM | POA: Diagnosis not present

## 2023-09-06 DIAGNOSIS — E1165 Type 2 diabetes mellitus with hyperglycemia: Secondary | ICD-10-CM | POA: Diagnosis not present

## 2023-09-06 DIAGNOSIS — E210 Primary hyperparathyroidism: Secondary | ICD-10-CM | POA: Diagnosis not present

## 2023-09-06 DIAGNOSIS — E039 Hypothyroidism, unspecified: Secondary | ICD-10-CM | POA: Diagnosis not present

## 2023-09-12 DIAGNOSIS — M8589 Other specified disorders of bone density and structure, multiple sites: Secondary | ICD-10-CM | POA: Diagnosis not present

## 2023-09-12 DIAGNOSIS — M85851 Other specified disorders of bone density and structure, right thigh: Secondary | ICD-10-CM | POA: Diagnosis not present

## 2023-09-15 DIAGNOSIS — R92323 Mammographic fibroglandular density, bilateral breasts: Secondary | ICD-10-CM | POA: Diagnosis not present

## 2023-09-15 DIAGNOSIS — Z1231 Encounter for screening mammogram for malignant neoplasm of breast: Secondary | ICD-10-CM | POA: Diagnosis not present

## 2023-09-21 ENCOUNTER — Other Ambulatory Visit (HOSPITAL_COMMUNITY)
Admission: RE | Admit: 2023-09-21 | Discharge: 2023-09-21 | Disposition: A | Discharge status: Home / Self Care | Payer: Self-pay | Source: Ambulatory Visit | Attending: Medical Genetics | Admitting: Medical Genetics

## 2023-09-22 DIAGNOSIS — E1165 Type 2 diabetes mellitus with hyperglycemia: Secondary | ICD-10-CM | POA: Diagnosis not present

## 2023-09-22 DIAGNOSIS — E1169 Type 2 diabetes mellitus with other specified complication: Secondary | ICD-10-CM | POA: Diagnosis not present

## 2023-09-22 DIAGNOSIS — E559 Vitamin D deficiency, unspecified: Secondary | ICD-10-CM | POA: Diagnosis not present

## 2023-09-22 DIAGNOSIS — I10 Essential (primary) hypertension: Secondary | ICD-10-CM | POA: Diagnosis not present

## 2023-09-22 DIAGNOSIS — E039 Hypothyroidism, unspecified: Secondary | ICD-10-CM | POA: Diagnosis not present

## 2023-09-30 LAB — GENECONNECT MOLECULAR SCREEN: Genetic Analysis Overall Interpretation: NEGATIVE

## 2023-10-06 DIAGNOSIS — N904 Leukoplakia of vulva: Secondary | ICD-10-CM | POA: Diagnosis not present

## 2023-10-06 DIAGNOSIS — B009 Herpesviral infection, unspecified: Secondary | ICD-10-CM | POA: Diagnosis not present

## 2023-10-06 DIAGNOSIS — Z90721 Acquired absence of ovaries, unilateral: Secondary | ICD-10-CM | POA: Diagnosis not present

## 2023-10-06 DIAGNOSIS — Z01419 Encounter for gynecological examination (general) (routine) without abnormal findings: Secondary | ICD-10-CM | POA: Diagnosis not present

## 2023-10-06 DIAGNOSIS — Z90710 Acquired absence of both cervix and uterus: Secondary | ICD-10-CM | POA: Diagnosis not present

## 2023-10-06 DIAGNOSIS — N760 Acute vaginitis: Secondary | ICD-10-CM | POA: Diagnosis not present

## 2023-11-08 DIAGNOSIS — E1169 Type 2 diabetes mellitus with other specified complication: Secondary | ICD-10-CM | POA: Diagnosis not present

## 2023-11-08 DIAGNOSIS — I10 Essential (primary) hypertension: Secondary | ICD-10-CM | POA: Diagnosis not present

## 2023-11-08 DIAGNOSIS — E039 Hypothyroidism, unspecified: Secondary | ICD-10-CM | POA: Diagnosis not present

## 2023-11-10 DIAGNOSIS — Z0100 Encounter for examination of eyes and vision without abnormal findings: Secondary | ICD-10-CM | POA: Diagnosis not present

## 2023-11-24 ENCOUNTER — Emergency Department (HOSPITAL_BASED_OUTPATIENT_CLINIC_OR_DEPARTMENT_OTHER): Admitting: Radiology

## 2023-11-24 ENCOUNTER — Emergency Department (HOSPITAL_BASED_OUTPATIENT_CLINIC_OR_DEPARTMENT_OTHER)
Admission: EM | Admit: 2023-11-24 | Discharge: 2023-11-24 | Disposition: A | Discharge status: Home / Self Care | Attending: Emergency Medicine | Admitting: Emergency Medicine

## 2023-11-24 ENCOUNTER — Other Ambulatory Visit: Payer: Self-pay

## 2023-11-24 ENCOUNTER — Ambulatory Visit: Payer: Self-pay

## 2023-11-24 DIAGNOSIS — Z7982 Long term (current) use of aspirin: Secondary | ICD-10-CM | POA: Insufficient documentation

## 2023-11-24 DIAGNOSIS — Z7984 Long term (current) use of oral hypoglycemic drugs: Secondary | ICD-10-CM | POA: Diagnosis not present

## 2023-11-24 DIAGNOSIS — R2231 Localized swelling, mass and lump, right upper limb: Secondary | ICD-10-CM | POA: Insufficient documentation

## 2023-11-24 DIAGNOSIS — M19011 Primary osteoarthritis, right shoulder: Secondary | ICD-10-CM | POA: Diagnosis not present

## 2023-11-24 DIAGNOSIS — E1122 Type 2 diabetes mellitus with diabetic chronic kidney disease: Secondary | ICD-10-CM | POA: Diagnosis not present

## 2023-11-24 DIAGNOSIS — Z79899 Other long term (current) drug therapy: Secondary | ICD-10-CM | POA: Insufficient documentation

## 2023-11-24 DIAGNOSIS — N189 Chronic kidney disease, unspecified: Secondary | ICD-10-CM | POA: Insufficient documentation

## 2023-11-24 DIAGNOSIS — E039 Hypothyroidism, unspecified: Secondary | ICD-10-CM | POA: Insufficient documentation

## 2023-11-24 DIAGNOSIS — R229 Localized swelling, mass and lump, unspecified: Secondary | ICD-10-CM | POA: Diagnosis not present

## 2023-11-24 DIAGNOSIS — I129 Hypertensive chronic kidney disease with stage 1 through stage 4 chronic kidney disease, or unspecified chronic kidney disease: Secondary | ICD-10-CM | POA: Diagnosis not present

## 2023-11-24 NOTE — ED Notes (Signed)

## 2023-11-24 NOTE — ED Triage Notes (Signed)
 Reports dexcom needle broke off in left arm 2 weeks ago.

## 2023-11-24 NOTE — ED Provider Notes (Signed)
 [place] EMERGENCY DEPARTMENT AT [facility] Provider Note   CSN: [id number] Arrival date & time: 11/24/23  1428     Patient presents with: Foreign Body in Skin   Beverly Harrison is a 63 y.o. female with past medical history of DM, HTN, hypothyroidism, CKD, neuromuscular disorder presents Emergency Department for evaluation of nodule to right humerus that started following removing her Dexcom needle from this exact area 2 weeks ago.  She reports that she followed up with her primary care provider on 11/08/23 and they attributed symptoms to reaction from needle.  Has since decreased in size significantly.  No fevers   HPI     Prior to Admission medications   Medication Sig Start Date End Date Taking? Authorizing Provider  ALPRAZolam  (XANAX ) 0.5 MG tablet Take 0.5 mg by mouth 3 (three) times daily as needed. For anxiety    [provider]  aspirin EC 81 MG tablet Take 81 mg by mouth daily.    [provider]  atenolol  (TENORMIN ) 50 MG tablet Take 25-50 mg by mouth daily. Take 1 tablet in the morning and 0.5 tablet in the evening.    [provider]  B Complex Vitamins (VITAMIN B COMPLEX ) TABS Take 1 tablet by mouth daily.    [provider]  Coenzyme Q10 (CO Q 10 PO) Take 1 capsule by mouth daily.    [provider]  EPINEPHrine  0.3 mg/0.3 mL IJ SOAJ injection Inject 0.3 mg into the muscle as needed for anaphylaxis. 10/26/22   Lorin Norris, MD  glyBURIDE  (DIABETA ) 1.25 MG tablet Take 1.25 mg by mouth every evening.    [provider]  ipratropium (ATROVENT ) 0.06 % nasal spray Place 2 sprays into both nostrils 4 (four) times daily. 10/26/22   Lorin Norris, MD  levothyroxine  (SYNTHROID , LEVOTHROID) 125 MCG tablet Take 125 mcg by mouth daily.    [provider]  linagliptin  (TRADJENTA ) 5 MG TABS tablet Take 5 mg by mouth every evening.    [provider]  losartan -hydrochlorothiazide  (HYZAAR) 100-12.5 MG per  tablet Take 1 tablet by mouth every evening.    [provider]  metFORMIN  (GLUMETZA ) 500 MG (MOD) 24 hr tablet Take 500 mg by mouth 2 (two) times daily with a meal.    [provider]  Multiple Vitamin (MULITIVITAMIN WITH MINERALS) TABS Take 1 tablet by mouth daily.    [provider]  omega-3 acid ethyl esters (LOVAZA ) 1 G capsule Take 1 g by mouth daily.    [provider]  pravastatin (PRAVACHOL) 10 MG tablet Take 10 mg by mouth at bedtime.    [provider]  zolpidem  (AMBIEN ) 5 MG tablet Take 5 mg by mouth at bedtime as needed. For sleep    [provider]    Allergies: Hydrocodone-acetaminophen  and Doxycycline    Review of Systems  Skin:  Negative for color change and wound.    Updated Vital Signs BP (!) 148/80 (BP Location: Right Arm)   Pulse 88   Temp 98.7 F (37.1 C) (Oral)   Resp 18   SpO2 99%   Physical Exam Vitals and nursing note reviewed.  Constitutional:      General: She is not in acute distress.    Appearance: Normal appearance.  HENT:     Head: Normocephalic and atraumatic.  Eyes:     Conjunctiva/sclera: Conjunctivae normal.  Cardiovascular:     Rate and Rhythm: Normal rate.     Pulses:  Radial pulses are 2+ on the right side.  Pulmonary:     Effort: Pulmonary effort is normal. No respiratory distress.  Musculoskeletal:     Comments: Mildly tender 1cm nodule above right posterior distal humerus.  No erythema, streaking, drainage, nor fluctuance to nodule.  Sensation 2/2 of RUE. ROM of right shoulder, elbow, wrist WNL.  No swelling, erythema, nor tenderness of shoulder, elbow, wrist. No general swelling to RUE  Skin:    Coloration: Skin is not jaundiced or pale.  Neurological:     Mental Status: She is alert. Mental status is at baseline.     (all labs ordered are listed, but only abnormal results are displayed) Labs Reviewed - No data to display  EKG: None  Radiology: DG Humerus  Right Result Date: 11/24/2023 CLINICAL DATA:  Concern for retained needle fragment EXAM: RIGHT HUMERUS - 2+ VIEW COMPARISON:  None Available. FINDINGS: Frontal and lateral views of the right humerus and upper arm are obtained on 2 images. Portions of the ventral soft tissues of the distal right forearm are excluded on the lateral view by collimation. No radiopaque foreign bodies are identified. Specifically, no evidence of retained needle fragment. No acute bony abnormalities. Mild degenerative changes of the right shoulder. IMPRESSION: 1. No radiopaque foreign body. 2. No acute bony abnormality. Electronically Signed   By: Ozell Daring M.D.   On: 11/24/2023 16:17     Medications Ordered in the ED - No data to display                                  Medical Decision Making Amount and/or Complexity of Data Reviewed Radiology: ordered.   Patient presents to the ED for concern of skin nodule, this involves an extensive number of treatment options, and is a complaint that carries with it a high risk of complications and morbidity.  The differential diagnosis includes cellulitis, abscess, hematoma, FB   Co morbidities that complicate the patient evaluation  See HPI   Additional history obtained:  Additional history obtained from Nursing   External records from outside source obtained and reviewed including triage note   Imaging Studies ordered:  I ordered imaging studies including right humerus x-ray I independently visualized and interpreted imaging which showed no retained foreign body I agree with the radiologist interpretation    Problem List / ED Course:  Skin nodule Mildly tender. No fluctuance, erythema, warmth, streaking to area. Does not appear to be an abscess, cellulitis nor infectious. Do not believe antibiotics are required XR negative for FB.  Did consider DVT, superficial thrombophlebitis however there is no erythema, swelling of arm (vs localized 1cm nodule),  nor streaking. Mildly tender. No hx of DVT/PE. Low suspicion Neurovascularly intact with no acute motor nor new sensory deficits. Ext is well perfused with radial 2+ Reassuring that nodule has significantly decreased since onset following removal of device (>50% reduction per patient) and has never had drainage nor erythema, streaking. Nodule likely 2/2 hematoma which has improved since onset. Has not tried ice packs, ibuprofen at home. Provided symptomatic treatment recommendations at home on C paperwork. Patient has follow up with PCP in one week and can reassess area to see if it continues to improve  Discussed disposition with patient who expressed understanding with the plan.  She is interested in being discharged at this time satisfied with current plan   Reevaluation:  After the interventions noted above, I  reevaluated the patient and found that they have :stayed the same   Dispostion:  After consideration of the diagnostic results and the patients response to treatment, I feel that the patent would benefit from outpatient management PCP follow-up in 1 week  Discussed ED workup, disposition, return to ED precautions with patient who expresses understanding agrees with plan.  All questions answered to their satisfaction.  They are agreeable to plan.  Discharge instructions provided on paperwork  Final diagnoses:  Skin nodule    ED Discharge Orders     None        Minnie Tinnie BRAVO, PA 11/24/23 1739    Cottie Donnice PARAS, MD 11/24/23 [phone]

## 2023-11-24 NOTE — Discharge Instructions (Signed)
 Thank you for letting us  evaluate you today.  Your x-ray did not show any foreign body nor bony abnormality.  You can use topical lidocaine , ice packs to try and decrease swelling.  Follow-up with PCP in next week  Return to Emergency Department if you experience different sensation deficits, numbness of your arm, significant swelling, redness, streaking from nodule, worsening symptoms.

## 2023-11-24 NOTE — Telephone Encounter (Signed)
 FYI Only or Action Required?: FYI only for provider: ED advised.  Patient was last seen in primary care on Not established.  Called Nurse Triage reporting Diabetic Supplies .  Symptoms began several weeks ago.  Interventions attempted: Nothing.  Symptoms are: stable.  Triage Disposition: Go to ED Now (or PCP Triage)  Patient/caregiver understands and will follow disposition?: Yes Reason for Disposition  Nursing judgment or information in reference  Answer Assessment - Initial Assessment Questions Patient states seen PCP on 11/08/23 and stated to give it 4-5 days it should go down. Had blood work done today and was advised to go to UC, called to make appointment and UC stated they won't do anything about it. Advised patient to ED.   1. REASON FOR CALL: What is your main concern right now?     Thinks dexcom sensor needle is stuck in arm  2. ONSET: When did the symptom start?     11/07/23  3. SEVERITY: How bad is the area?     Swollen knot, was extremely painful, now not as bad but still painful  4. FEVER: Do you have a fever?     Denies  5. OTHER SYMPTOMS: Do you have any other new symptoms?     Denies  Protocols used: No Guideline Available-[facility]  Copied from CRM #[id number]. Topic: Clinical - Red Word Triage >> Nov 24, 2023 12:04 PM Carrielelia G wrote: Red Word that prompted transfer to Nurse Triage: Patient Sittner has a diabetic dexcom sensor and she was trying to take it off and the needed is stuck in her arm going on 2 weeks.

## 2023-12-01 DIAGNOSIS — E039 Hypothyroidism, unspecified: Secondary | ICD-10-CM | POA: Diagnosis not present

## 2023-12-01 DIAGNOSIS — E1165 Type 2 diabetes mellitus with hyperglycemia: Secondary | ICD-10-CM | POA: Diagnosis not present

## 2023-12-01 DIAGNOSIS — R300 Dysuria: Secondary | ICD-10-CM | POA: Diagnosis not present

## 2023-12-01 DIAGNOSIS — E1169 Type 2 diabetes mellitus with other specified complication: Secondary | ICD-10-CM | POA: Diagnosis not present

## 2023-12-01 DIAGNOSIS — E559 Vitamin D deficiency, unspecified: Secondary | ICD-10-CM | POA: Diagnosis not present

## 2023-12-01 DIAGNOSIS — I10 Essential (primary) hypertension: Secondary | ICD-10-CM | POA: Diagnosis not present

## 2023-12-05 DIAGNOSIS — E1165 Type 2 diabetes mellitus with hyperglycemia: Secondary | ICD-10-CM | POA: Diagnosis not present

## 2023-12-05 DIAGNOSIS — E559 Vitamin D deficiency, unspecified: Secondary | ICD-10-CM | POA: Diagnosis not present

## 2023-12-05 DIAGNOSIS — E039 Hypothyroidism, unspecified: Secondary | ICD-10-CM | POA: Diagnosis not present

## 2023-12-05 DIAGNOSIS — I10 Essential (primary) hypertension: Secondary | ICD-10-CM | POA: Diagnosis not present

## 2023-12-12 DIAGNOSIS — R2231 Localized swelling, mass and lump, right upper limb: Secondary | ICD-10-CM | POA: Diagnosis not present

## 2023-12-12 DIAGNOSIS — M795 Residual foreign body in soft tissue: Secondary | ICD-10-CM | POA: Diagnosis not present

## 2024-01-06 DIAGNOSIS — E038 Other specified hypothyroidism: Secondary | ICD-10-CM | POA: Diagnosis not present

## 2024-01-06 DIAGNOSIS — E782 Mixed hyperlipidemia: Secondary | ICD-10-CM | POA: Diagnosis not present

## 2024-01-06 DIAGNOSIS — R310 Gross hematuria: Secondary | ICD-10-CM | POA: Diagnosis not present

## 2024-01-06 DIAGNOSIS — I10 Essential (primary) hypertension: Secondary | ICD-10-CM | POA: Diagnosis not present

## 2024-01-06 DIAGNOSIS — E1165 Type 2 diabetes mellitus with hyperglycemia: Secondary | ICD-10-CM | POA: Diagnosis not present
